# Patient Record
Sex: Male | Born: 1950 | Race: White | Hispanic: No | Smoking: Current every day smoker
Health system: Southern US, Community
[De-identification: ages and names within clinical notes are randomized; demographics above are authoritative.]

## PROBLEM LIST (undated history)

## (undated) DIAGNOSIS — T4145XA Adverse effect of unspecified anesthetic, initial encounter: Secondary | ICD-10-CM

## (undated) DIAGNOSIS — T8859XA Other complications of anesthesia, initial encounter: Secondary | ICD-10-CM

## (undated) DIAGNOSIS — I10 Essential (primary) hypertension: Secondary | ICD-10-CM

## (undated) DIAGNOSIS — R739 Hyperglycemia, unspecified: Secondary | ICD-10-CM

## (undated) DIAGNOSIS — Z87442 Personal history of urinary calculi: Secondary | ICD-10-CM

## (undated) DIAGNOSIS — F17200 Nicotine dependence, unspecified, uncomplicated: Secondary | ICD-10-CM

## (undated) DIAGNOSIS — J189 Pneumonia, unspecified organism: Secondary | ICD-10-CM

## (undated) DIAGNOSIS — M545 Low back pain, unspecified: Secondary | ICD-10-CM

## (undated) DIAGNOSIS — I00 Rheumatic fever without heart involvement: Secondary | ICD-10-CM

## (undated) DIAGNOSIS — M109 Gout, unspecified: Secondary | ICD-10-CM

## (undated) DIAGNOSIS — R7303 Prediabetes: Secondary | ICD-10-CM

## (undated) DIAGNOSIS — M1990 Unspecified osteoarthritis, unspecified site: Secondary | ICD-10-CM

## (undated) DIAGNOSIS — E785 Hyperlipidemia, unspecified: Secondary | ICD-10-CM

## (undated) HISTORY — DX: Hyperglycemia, unspecified: R73.9

## (undated) HISTORY — DX: Essential (primary) hypertension: I10

## (undated) HISTORY — DX: Low back pain, unspecified: M54.50

## (undated) HISTORY — DX: Gout, unspecified: M10.9

## (undated) HISTORY — DX: Hyperlipidemia, unspecified: E78.5

## (undated) HISTORY — PX: TONSILLECTOMY: SUR1361

## (undated) HISTORY — DX: Nicotine dependence, unspecified, uncomplicated: F17.200

## (undated) HISTORY — PX: APPENDECTOMY: SHX54

## (undated) HISTORY — DX: Unspecified osteoarthritis, unspecified site: M19.90

## (undated) HISTORY — DX: Rheumatic fever without heart involvement: I00

---
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
## 2012-05-25 ENCOUNTER — Emergency Department: Payer: Self-pay | Admitting: Emergency Medicine

## 2012-05-25 LAB — CBC
HCT: 44.5 % (ref 40.0–52.0)
HGB: 15.3 g/dL (ref 13.0–18.0)
Platelet: 201 10*3/uL (ref 150–440)
RDW: 14.1 % (ref 11.5–14.5)
WBC: 18 10*3/uL — ABNORMAL HIGH (ref 3.8–10.6)

## 2012-05-25 LAB — BASIC METABOLIC PANEL
Anion Gap: 8 (ref 7–16)
BUN: 16 mg/dL (ref 7–18)
Calcium, Total: 8.6 mg/dL (ref 8.5–10.1)
Creatinine: 1.63 mg/dL — ABNORMAL HIGH (ref 0.60–1.30)
EGFR (Non-African Amer.): 45 — ABNORMAL LOW
Glucose: 140 mg/dL — ABNORMAL HIGH (ref 65–99)
Osmolality: 285 (ref 275–301)
Potassium: 3.8 mmol/L (ref 3.5–5.1)

## 2012-05-25 LAB — URINALYSIS, COMPLETE
Bilirubin,UR: NEGATIVE
Glucose,UR: NEGATIVE mg/dL (ref 0–75)
Hyaline Cast: 7
Nitrite: NEGATIVE
Ph: 5 (ref 4.5–8.0)
Protein: NEGATIVE
Specific Gravity: 1.026 (ref 1.003–1.030)
WBC UR: 2 /HPF (ref 0–5)

## 2012-05-25 IMAGING — CT CT STONE STUDY
1 of 2 series · 15 of 32 positions shown, 19 images · non-contrast
Comparison: none

REASON FOR EXAM: SX OF RENAL COLIC
COMMENTS:

PROCEDURE:     CT  - CT ABDOMEN /PELVIS WO (STONE)  - [DATE]  [DATE]
RESULT:     Comparison: None
TECHNIQUE: Multiple axial images from the lung bases to the symphysis pubis
were obtained without oral and without intravenous contrast.

[Series 2: 3mm soft tissue · axial · 0.81mm/px · z∈[-933,-438]mm · 15 of 181 slices shown, 19 images]
[im 8/181  soft-tissue]
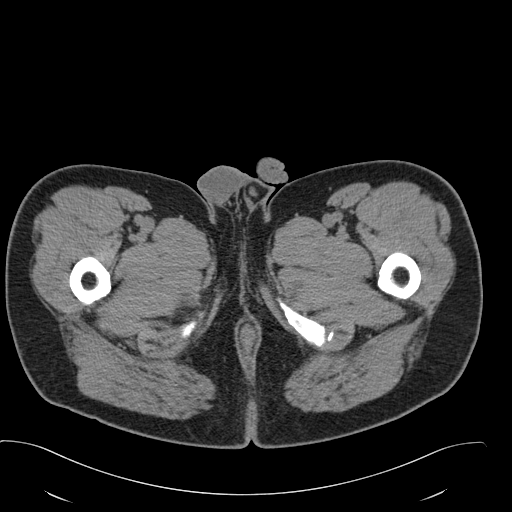
[im 8/181  bone]
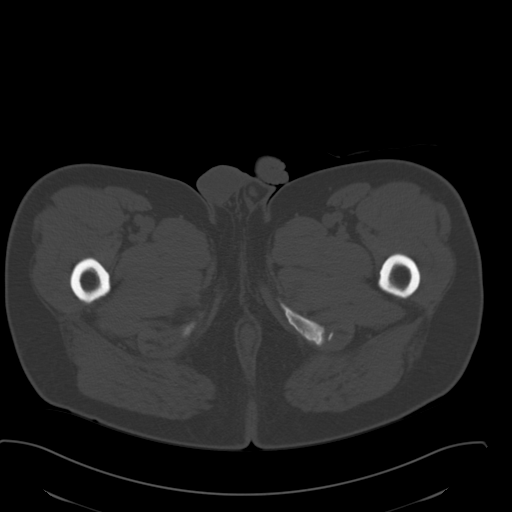
[im 24/181  soft-tissue]
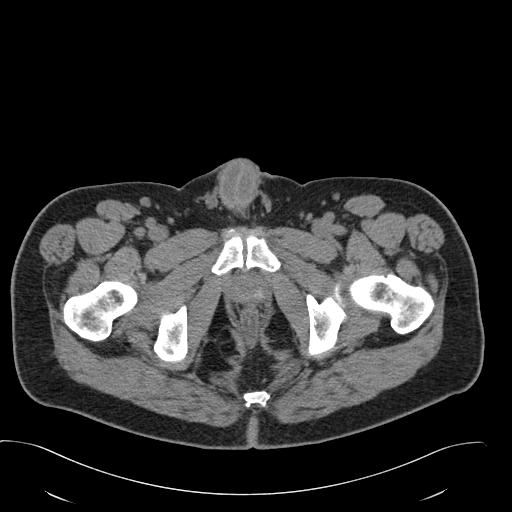
[im 40/181  soft-tissue]
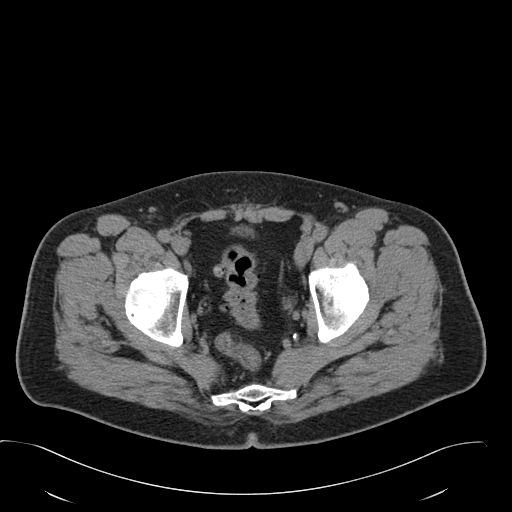
[im 47/181  soft-tissue]
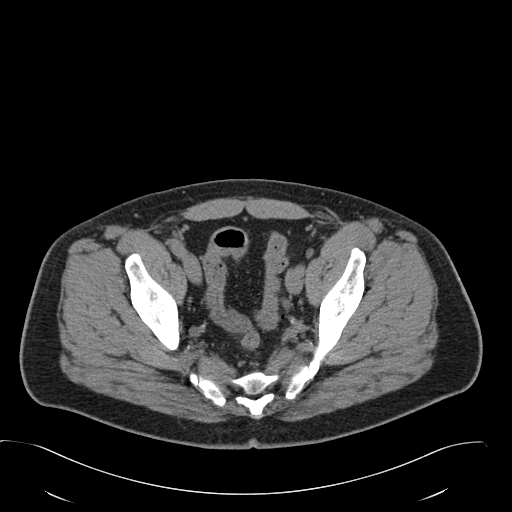
[im 63/181  soft-tissue]
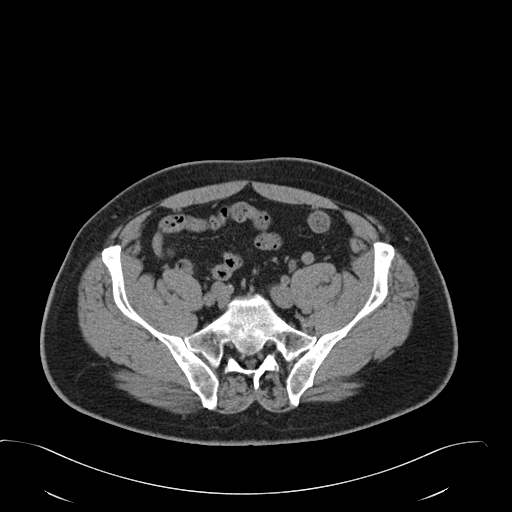
[im 79/181  soft-tissue]
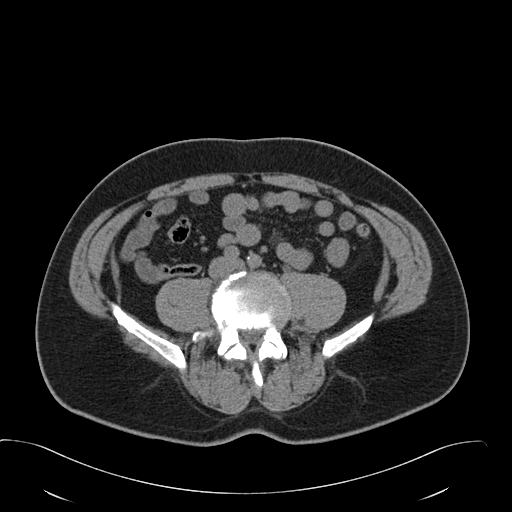
[im 94/181  soft-tissue]
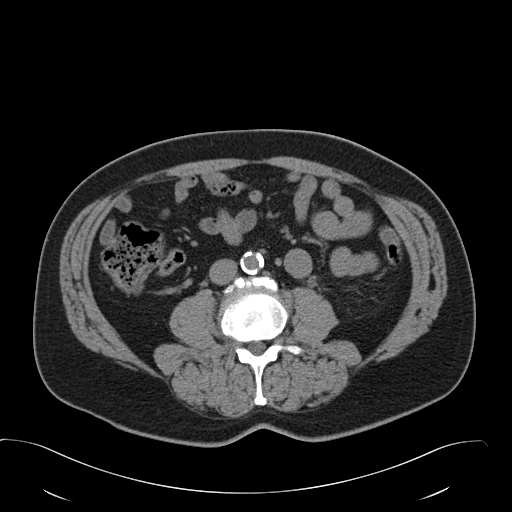
[im 102/181  soft-tissue]
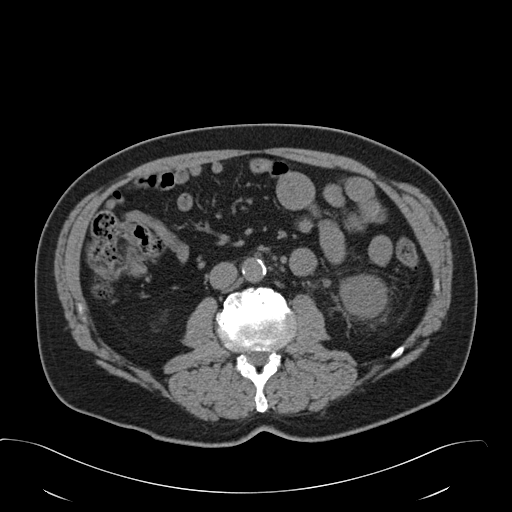
[im 118/181  soft-tissue]
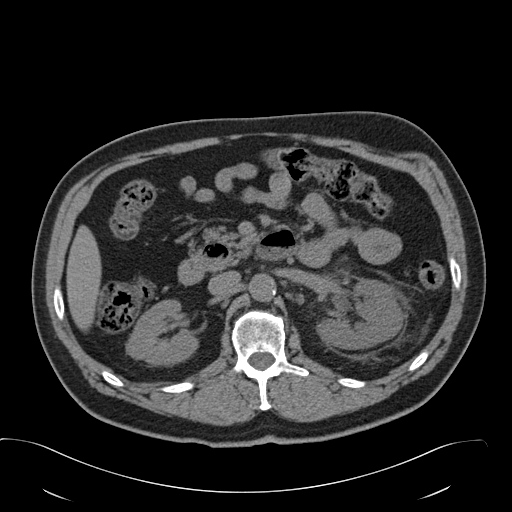
[im 118/181  bone]
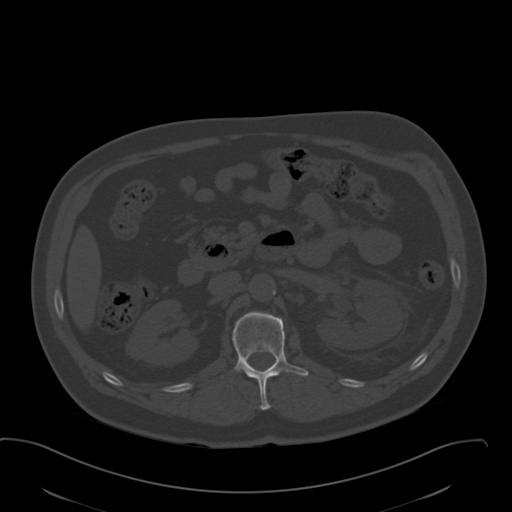
[im 134/181  soft-tissue]
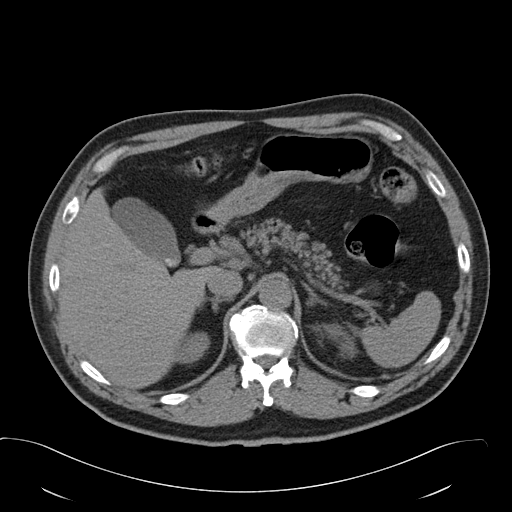
[im 141/181  soft-tissue]
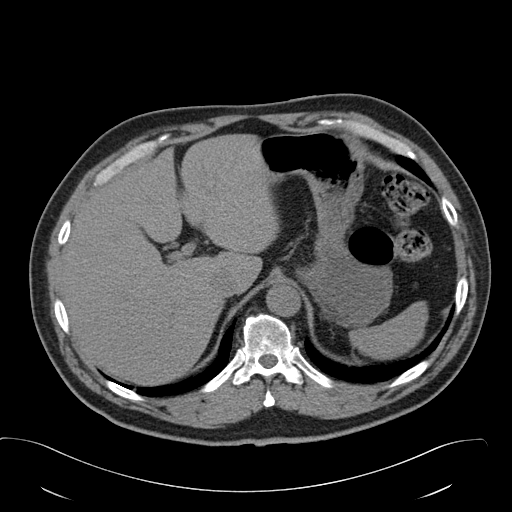
[im 149/181  lung]
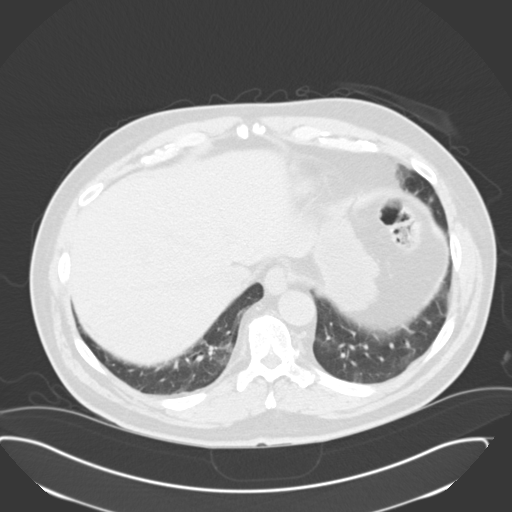
[im 157/181  soft-tissue]
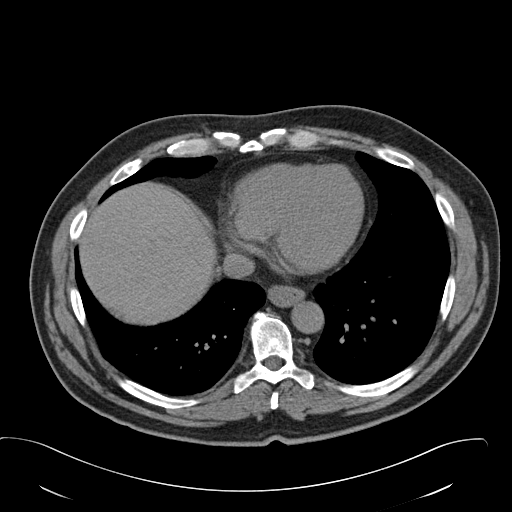
[im 157/181  lung]
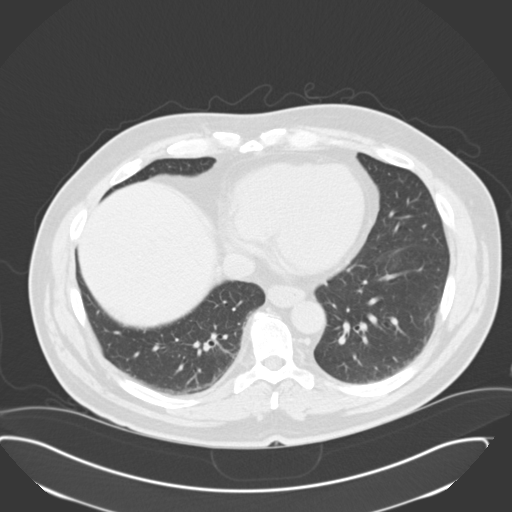
[im 165/181  lung]
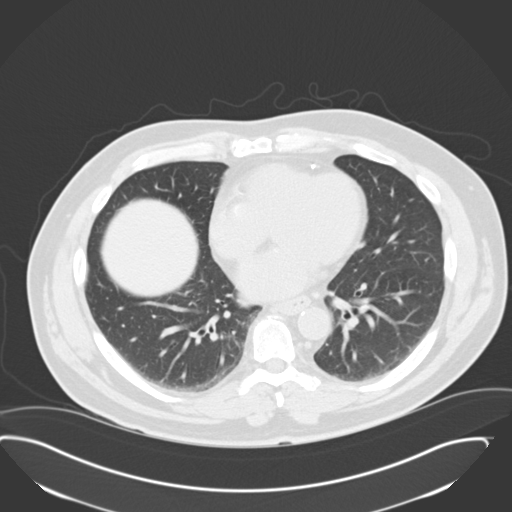
[im 173/181  soft-tissue]
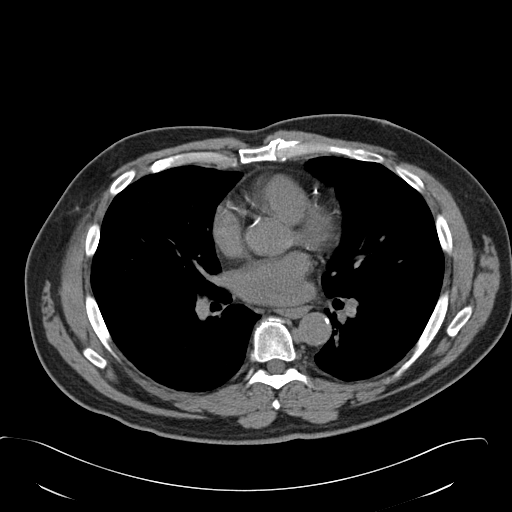
[im 173/181  lung]
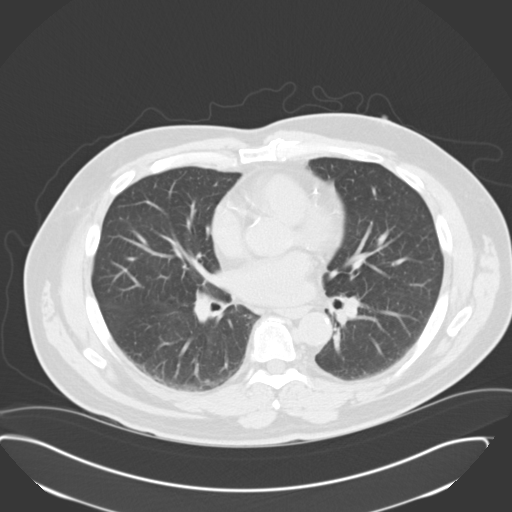

[15 of 32 positions shown; findings below may reference images not displayed]

FINDINGS: There is a 4 mm nodule at the posterior inferior costophrenic angle of the
right lower lobe, image 41. Mild basilar opacities are likely secondary to
atelectasis. There is a 6 mm subpleural nodule in the left lower lobe, image
28. Calcifications are seen in the coronary arteries.

Lack of intravenous contrast limits evaluation of the solid abdominal
organs.  Grossly, the liver, spleen, adrenals, and pancreas are
unremarkable. There is a small duodenal diverticulum extending medially from
the a second segment of the duodenum. Several calcified stones are seen
within the gallbladder.

There is mild left perinephric stranding. There is mild left
pelvicaliectasis and left ureterectasis. This is seen to the level of the
left ureterovesical junction where there is a 3 mm calculus.

There is mild diverticulosis of the sigmoid colon. The small and large bowel
are normal in caliber. There is mild diverticulosis of the descending colon.
The appendix is not visualized. However, there are no inflammatory changes
in the right lower quadrant. There is a small sclerotic density in the left
iliac wing which likely represents a bone island.
IMPRESSION: 1. There is a 3 mm calculus in the left ureterovesical junction causing mild
proximal obstruction.
2. Indeterminate pulmonary nodules, the largest of which measures 6 mm. If
the patient is at low risk for lung cancer, recommend 12 month follow-up
noncontrast chest CT.  If the patient is at high risk for lung cancer,
recommend 6 to 12 month follow-up noncontrast chest CT.

## 2012-05-25 IMAGING — CR DG CHEST 1V PORT
1 series · 1 of 1 positions shown · non-contrast
Comparison: none

REASON FOR EXAM: nodules on lung base on abdd ct
COMMENTS:

PROCEDURE:     DXR - DXR PORTABLE CHEST SINGLE VIEW  - [DATE] [DATE]
RESULT:     The lungs are mildly hypoinflated especially on the right. The
cardiac silhouette is normal in size. The perihilar lung markings are
increased. No discrete nodules are demonstrated.

[portable]
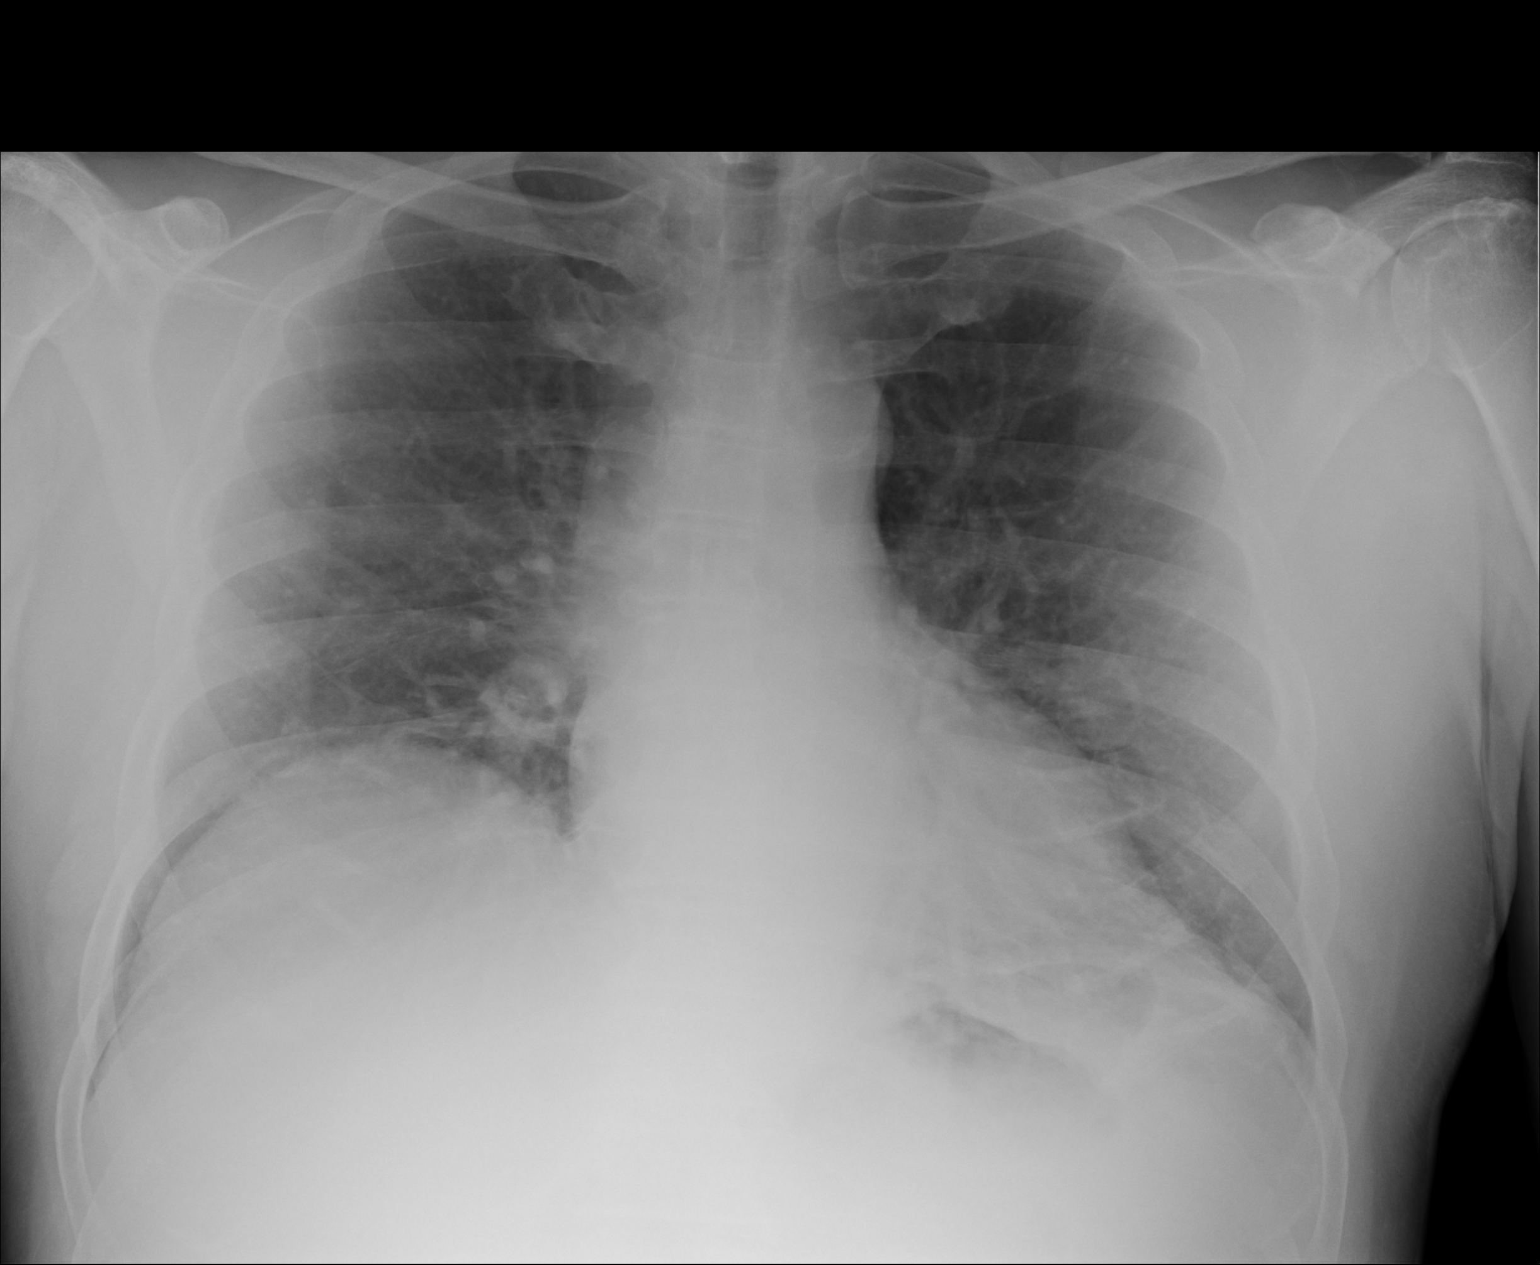

[1 of 1 positions shown; findings below may reference images not displayed]

IMPRESSION: There is bilateral pulmonary hypoinflation greatest on the
right. I do not see evidence of pneumonia nor abnormal nodules. A followup
PA and lateral chest x-ray with deep inspiration would be useful.

## 2019-11-02 ENCOUNTER — Ambulatory Visit (INDEPENDENT_AMBULATORY_CARE_PROVIDER_SITE_OTHER)
Admission: RE | Admit: 2019-11-02 | Discharge: 2019-11-02 | Disposition: A | Discharge status: Home / Self Care | Payer: Medicare Other | Source: Ambulatory Visit | Attending: Family Medicine | Admitting: Family Medicine

## 2019-11-02 ENCOUNTER — Ambulatory Visit (INDEPENDENT_AMBULATORY_CARE_PROVIDER_SITE_OTHER): Payer: Medicare Other | Admitting: Family Medicine

## 2019-11-02 ENCOUNTER — Other Ambulatory Visit: Payer: Self-pay

## 2019-11-02 ENCOUNTER — Encounter: Payer: Self-pay | Admitting: Family Medicine

## 2019-11-02 VITALS — BP 154/102 | HR 81 | Temp 96.6°F | Ht 67.0 in | Wt 210.0 lb

## 2019-11-02 DIAGNOSIS — M545 Low back pain, unspecified: Secondary | ICD-10-CM

## 2019-11-02 DIAGNOSIS — M109 Gout, unspecified: Secondary | ICD-10-CM | POA: Diagnosis not present

## 2019-11-02 DIAGNOSIS — M7918 Myalgia, other site: Secondary | ICD-10-CM

## 2019-11-02 DIAGNOSIS — I10 Essential (primary) hypertension: Secondary | ICD-10-CM

## 2019-11-02 DIAGNOSIS — M1990 Unspecified osteoarthritis, unspecified site: Secondary | ICD-10-CM

## 2019-11-02 DIAGNOSIS — E785 Hyperlipidemia, unspecified: Secondary | ICD-10-CM

## 2019-11-02 DIAGNOSIS — R739 Hyperglycemia, unspecified: Secondary | ICD-10-CM | POA: Diagnosis not present

## 2019-11-02 DIAGNOSIS — F17200 Nicotine dependence, unspecified, uncomplicated: Secondary | ICD-10-CM

## 2019-11-02 DIAGNOSIS — Z7189 Other specified counseling: Secondary | ICD-10-CM

## 2019-11-02 IMAGING — DX DG HIP (WITH OR WITHOUT PELVIS) 2V BILAT
5 series · 5 of 5 positions shown · non-contrast
Comparison: None.

CLINICAL DATA: Buttock pain

EXAM:
DG HIP (WITH OR WITHOUT PELVIS) 2V BILAT

[pelvis ap]
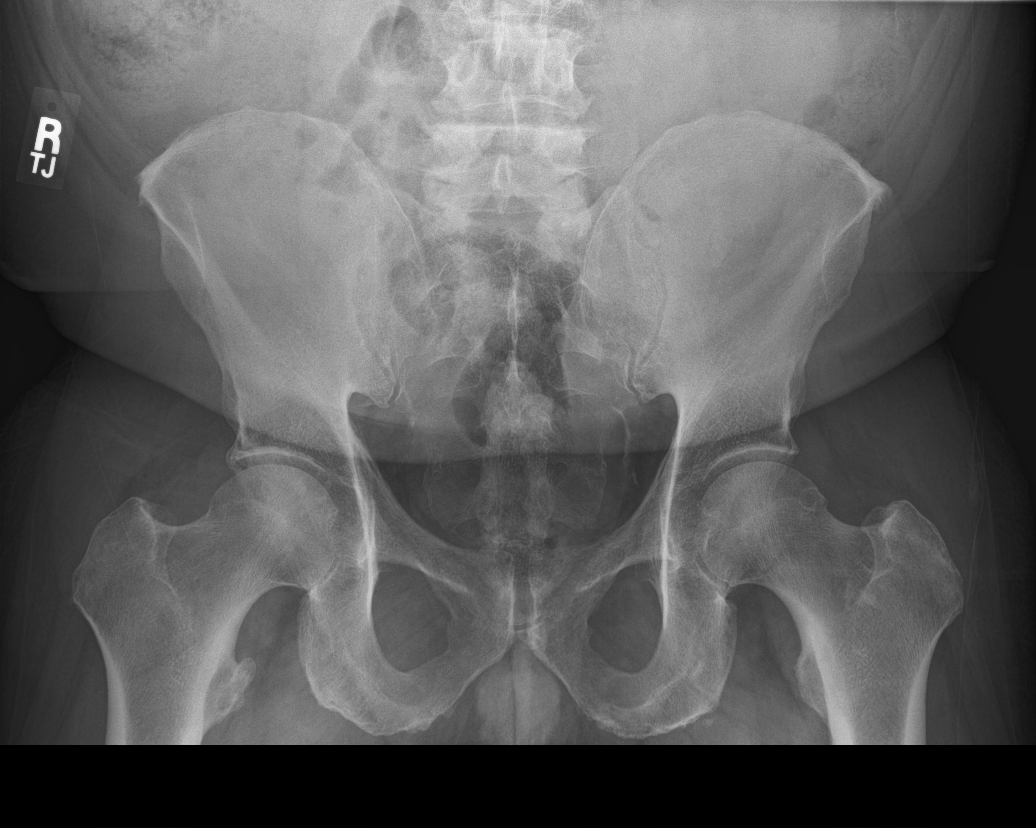

[hip ap (1 of 2)]
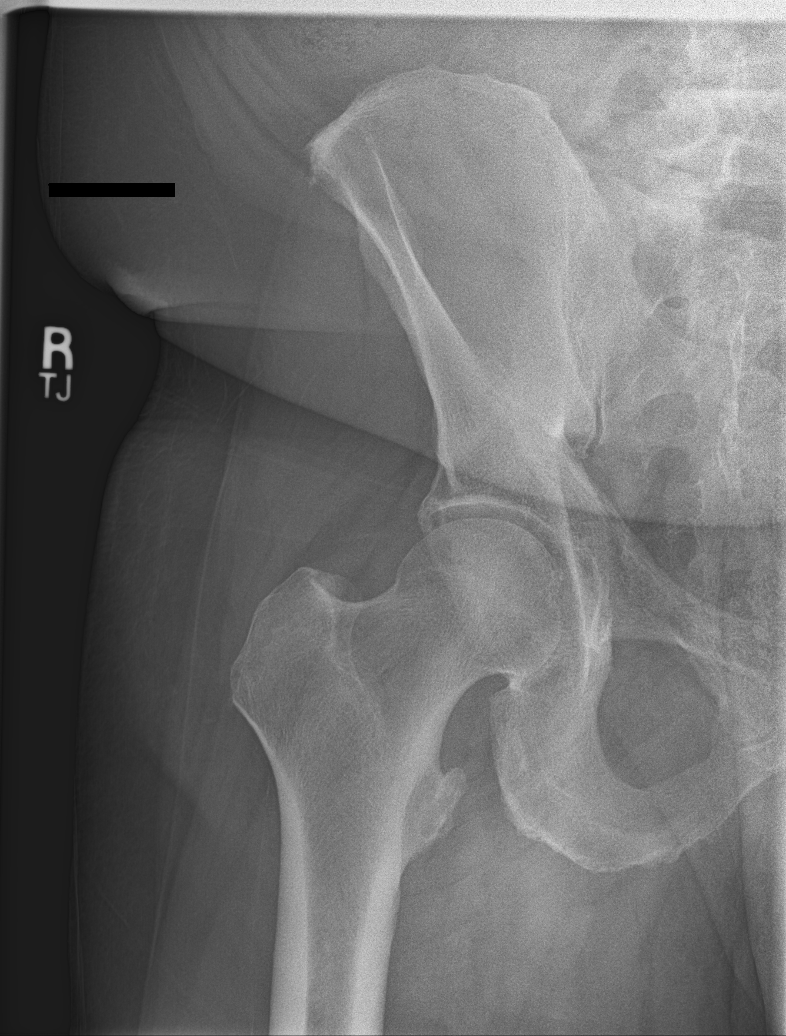

[hip lat (1 of 2)]
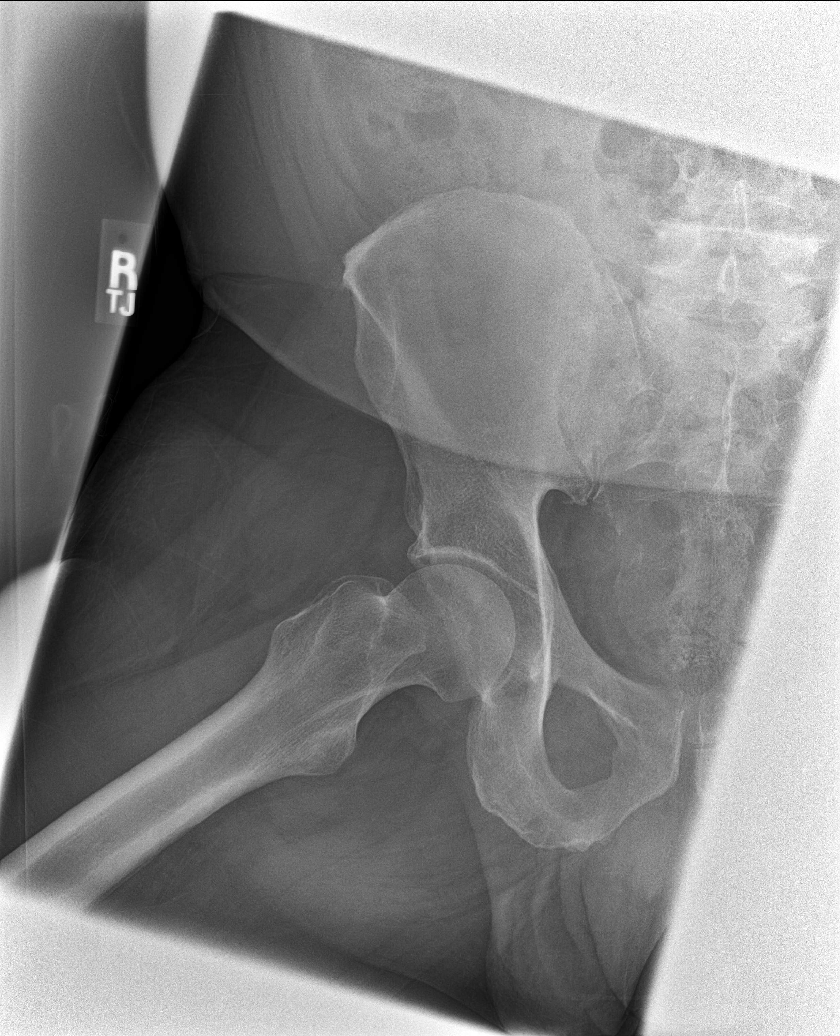

[hip ap (2 of 2)]
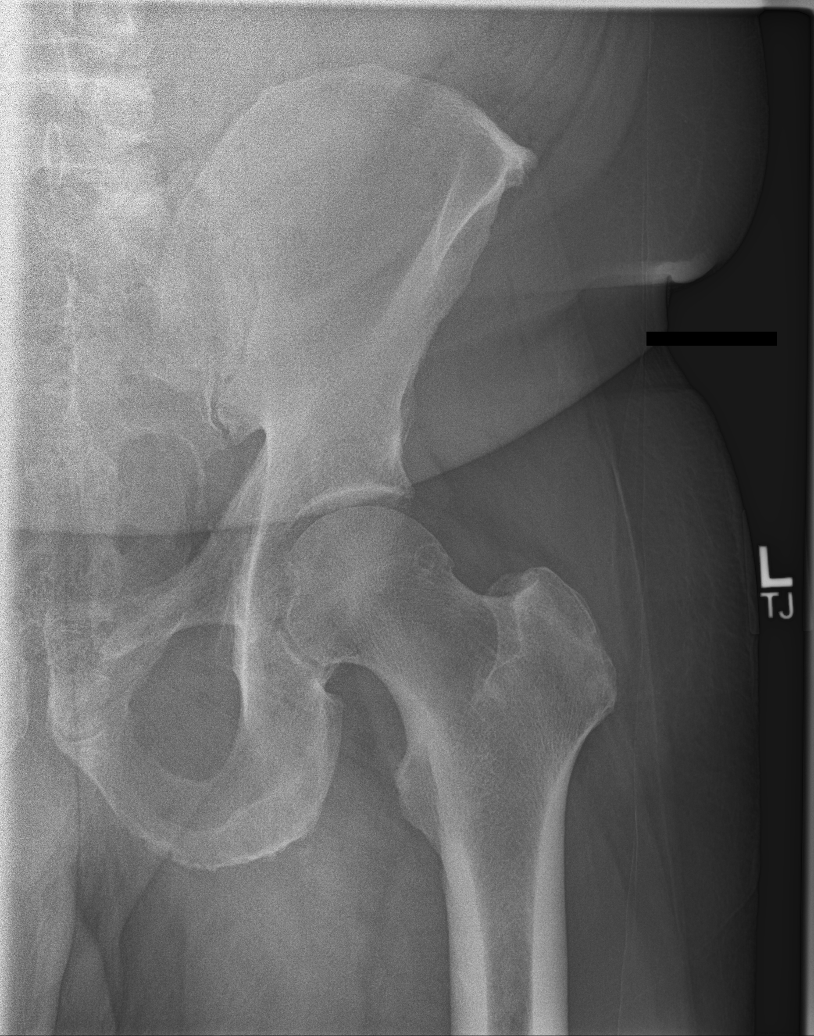

[hip lat (2 of 2)]
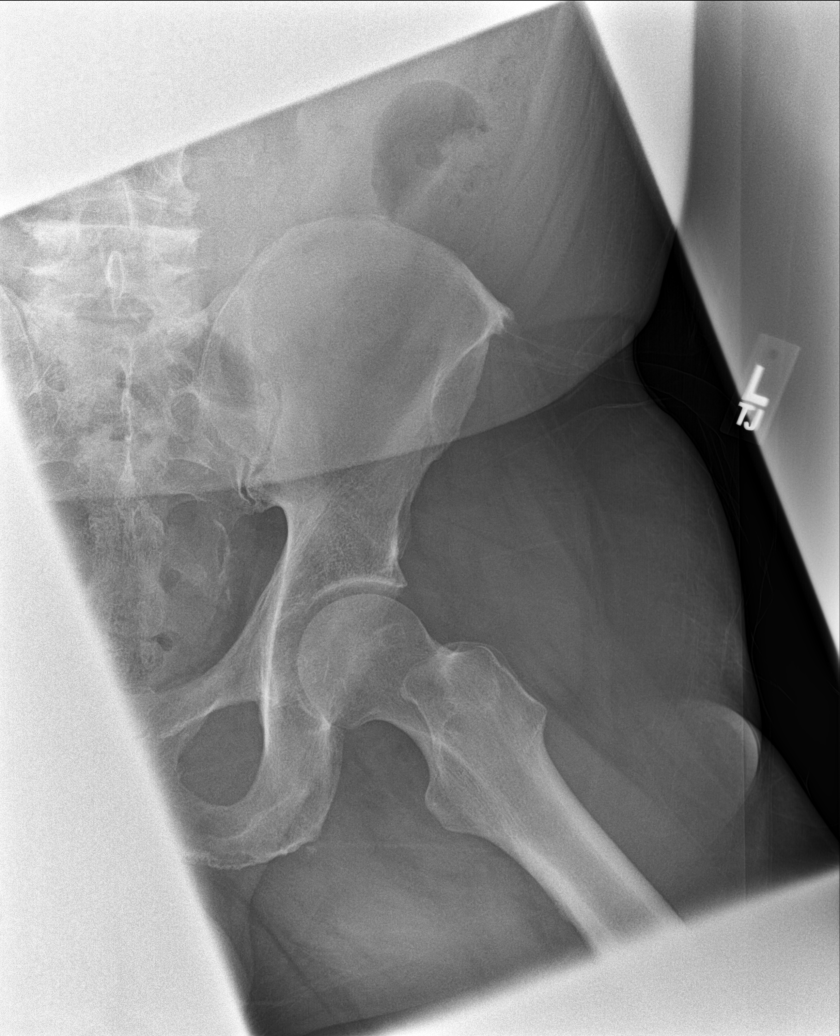

[5 of 5 positions shown; findings below may reference images not displayed]

FINDINGS: Mild joint space narrowing on the right. No fracture or
malalignment. Minimal femoral head neck spurring on the left. Pubic
symphysis and rami appear intact
IMPRESSION: Minimal degenerative change.  Otherwise negative

## 2019-11-02 IMAGING — DX DG LUMBAR SPINE COMPLETE 4+V
5 series · 5 of 5 positions shown · non-contrast
Comparison: CT [DATE]

CLINICAL DATA: Low back pain

EXAM:
LUMBAR SPINE - COMPLETE 4+ VIEW

[l-spine ap]
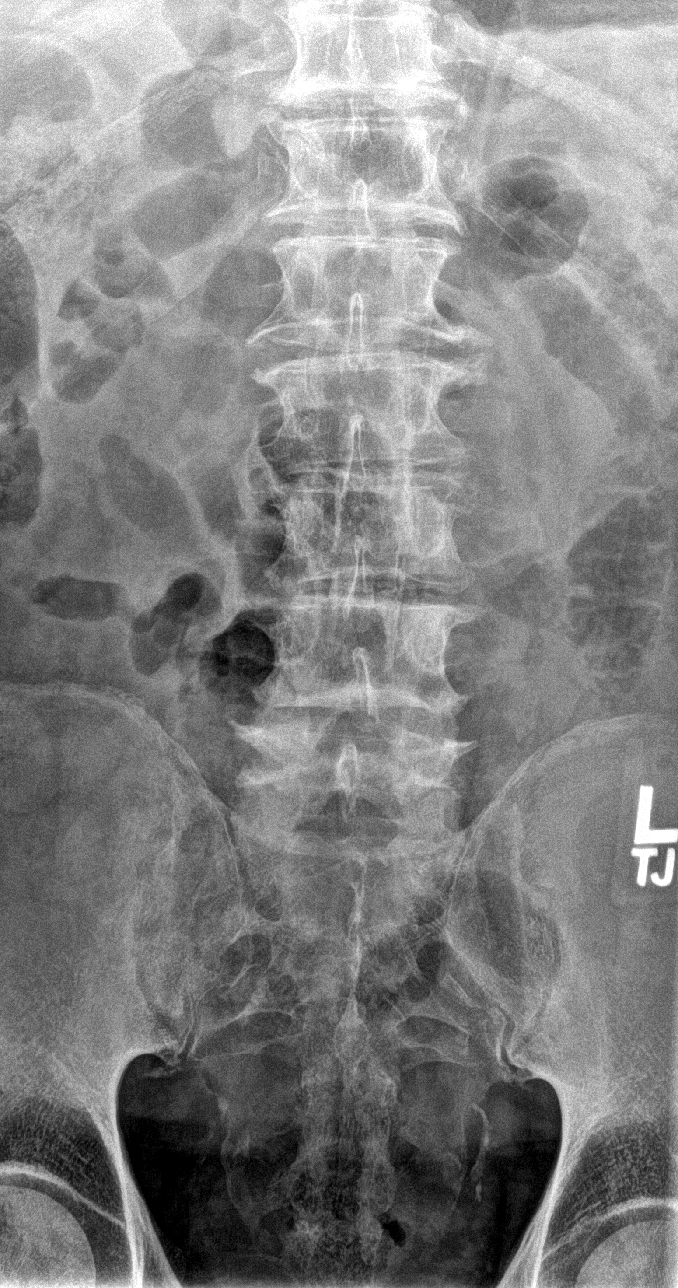

[l-spine obl (1 of 2)]
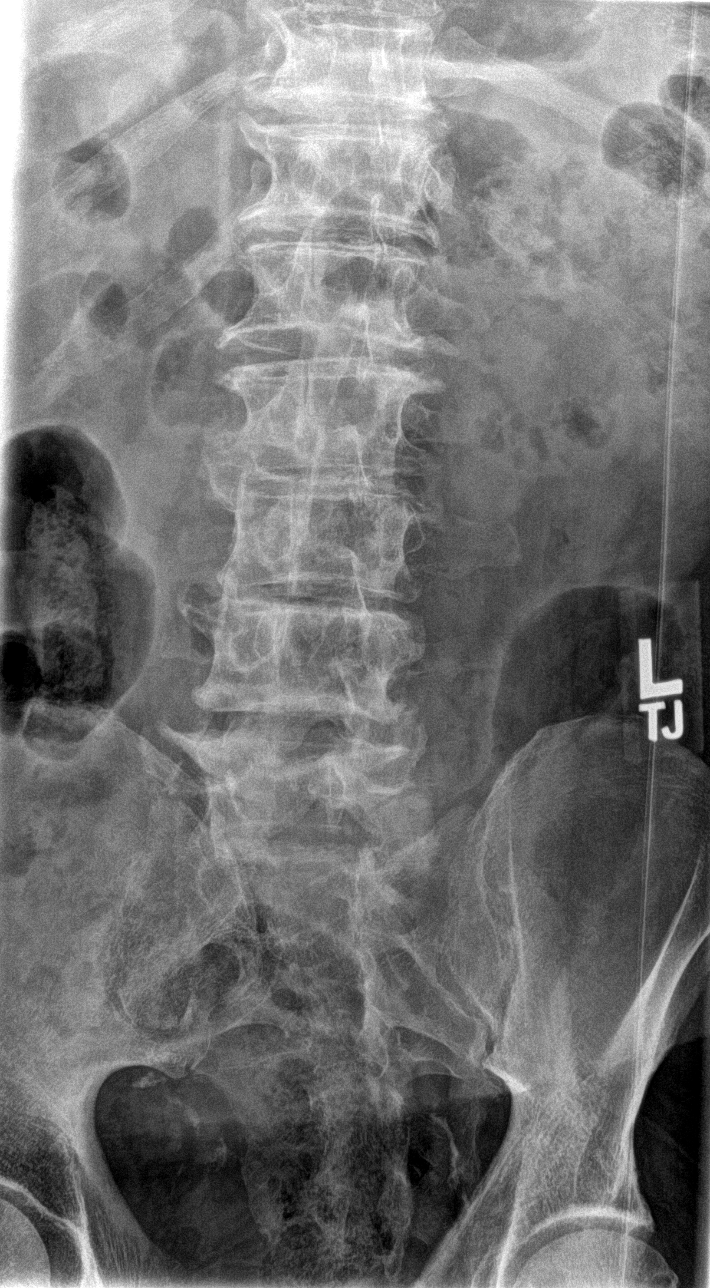

[l-spine obl (2 of 2)]
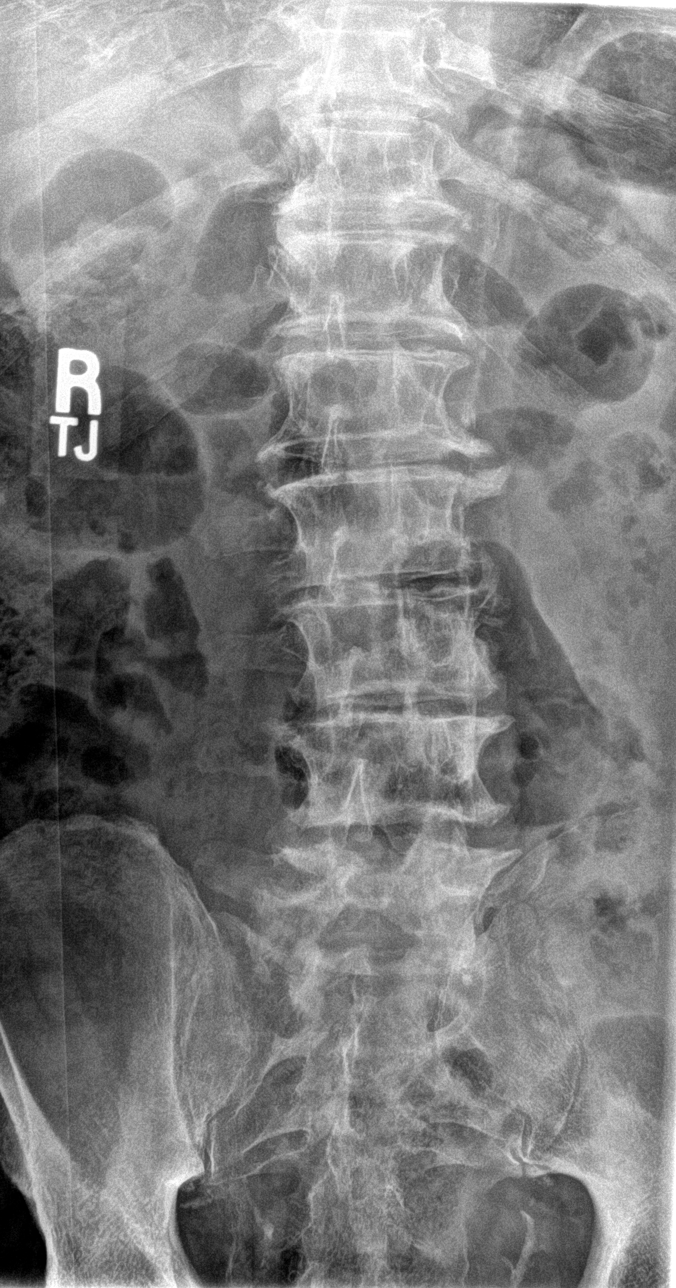

[l-spine lat]
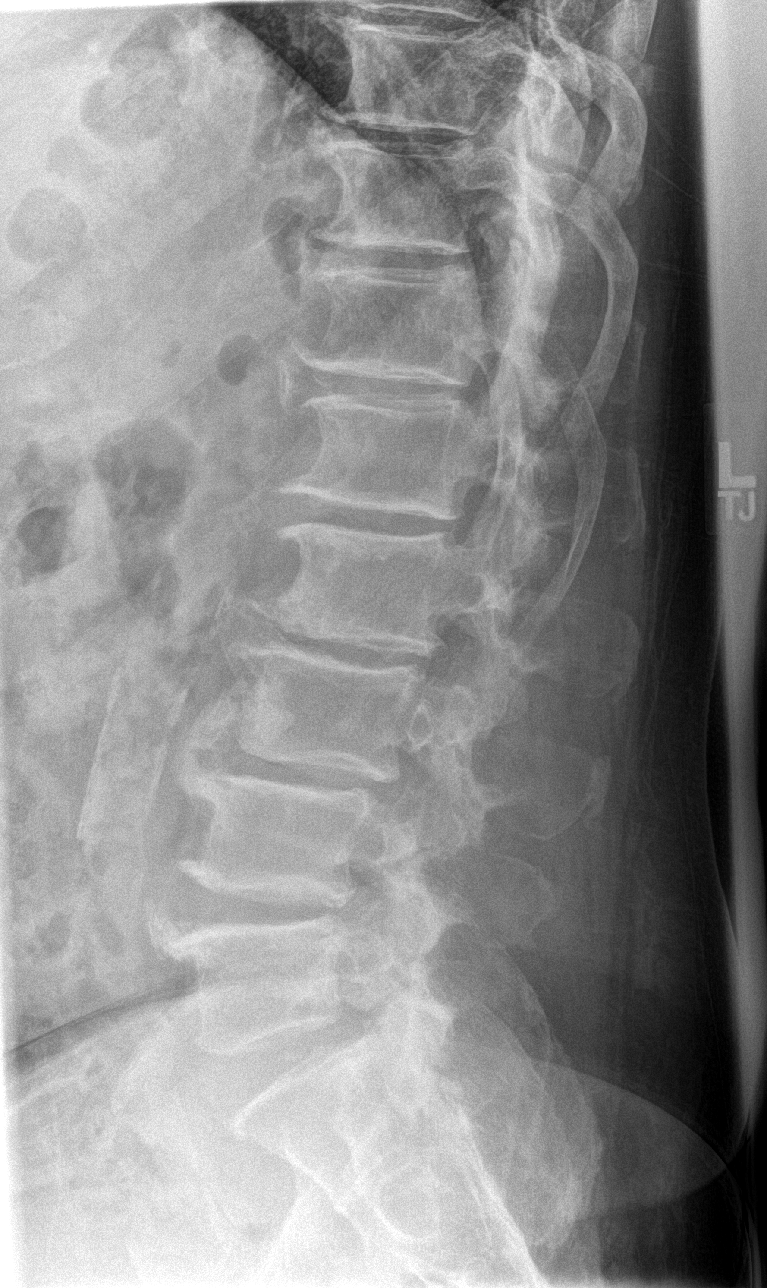

[l-spine l5/s1]
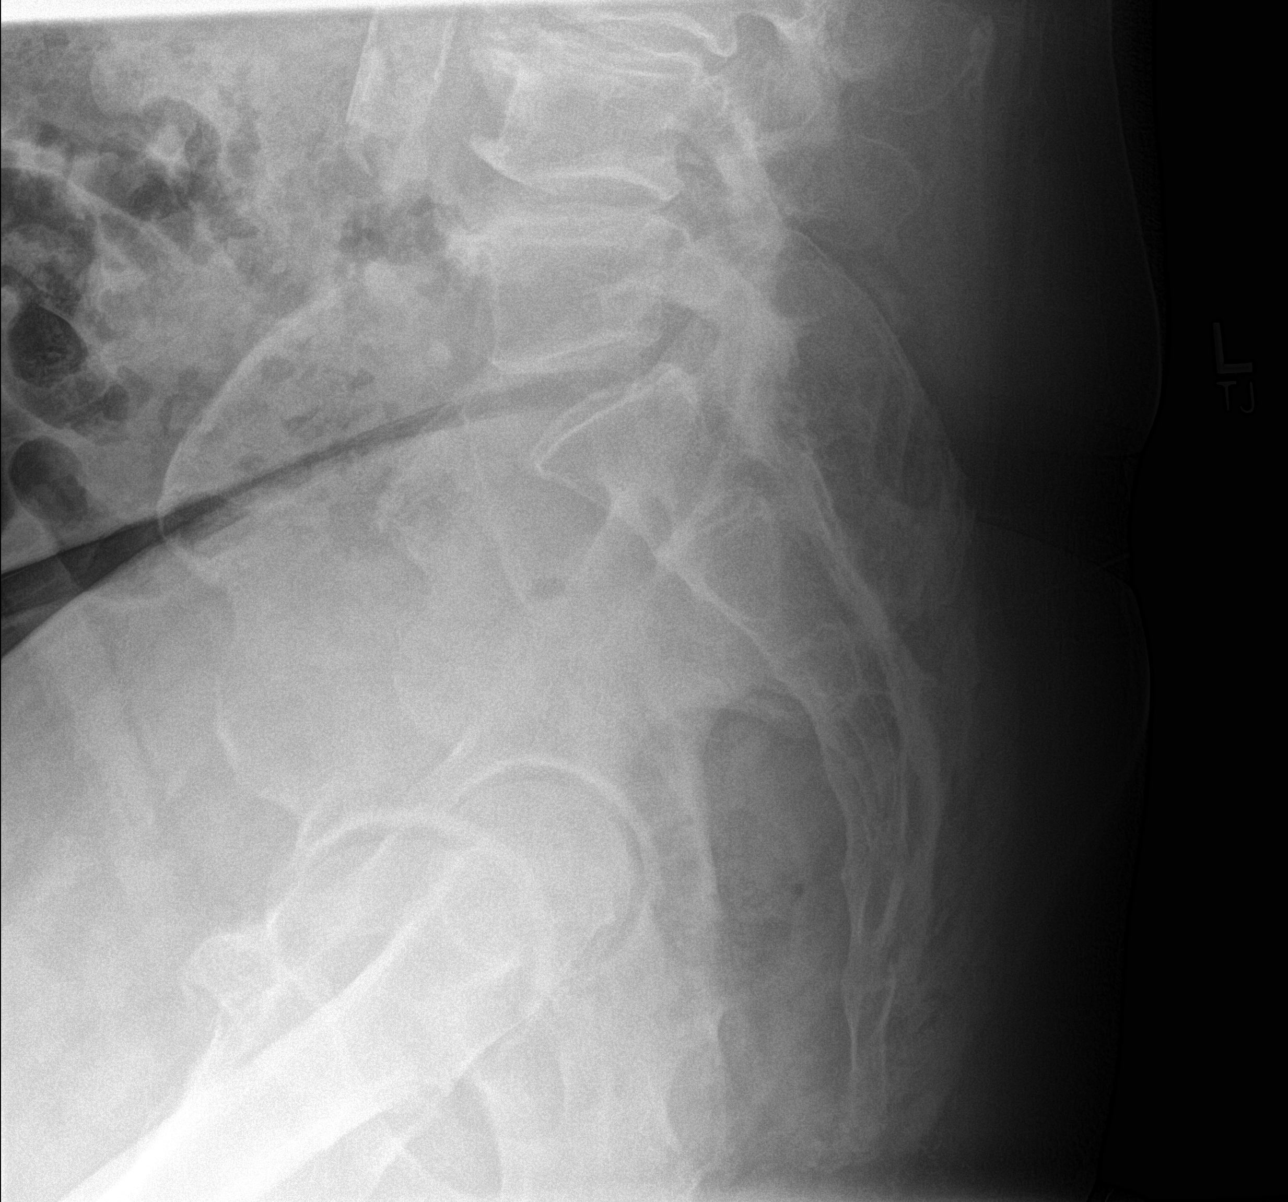

[5 of 5 positions shown; findings below may reference images not displayed]

FINDINGS: Five non rib-bearing lumbar type vertebra. 6 mm retrolisthesis L3 on
L4. Vertebral body heights are normal. Mild disc space narrowing at
L4-L5 and mild to moderate disc space narrowing L1 through L4. Bulky
anterior osteophytes at most levels. Facet degenerative changes of
the lower lumbar spine. Aortic atherosclerosis
IMPRESSION: 1. No acute osseous abnormality
2. Trace retrolisthesis L3 on L4. Diffuse degenerative changes of
the lumbar spine

## 2019-11-02 MED ORDER — METFORMIN HCL ER (MOD) 500 MG PO TB24: ORAL_TABLET | ORAL | Status: DC

## 2019-11-02 NOTE — Patient Instructions (Addendum)
Go to the lab on the way out.  We'll contact you with your lab and xray report.  Checking sugar, kidney, liver, cholesterol, gout and blood counts today.  Don't change your meds for now.  We'll request your old records.  Take care.  Glad to see you.

## 2019-11-03 LAB — COMPREHENSIVE METABOLIC PANEL
AG Ratio: 1.7 (calc) (ref 1.0–2.5)
ALT: 15 U/L (ref 9–46)
AST: 24 U/L (ref 10–35)
Albumin: 4.8 g/dL (ref 3.6–5.1)
Alkaline phosphatase (APISO): 45 U/L (ref 35–144)
BUN: 12 mg/dL (ref 7–25)
CO2: 35 mmol/L — ABNORMAL HIGH (ref 20–32)
Calcium: 9.7 mg/dL (ref 8.6–10.3)
Chloride: 98 mmol/L (ref 98–110)
Creat: 1.2 mg/dL (ref 0.70–1.25)
Globulin: 2.8 g/dL (calc) (ref 1.9–3.7)
Glucose, Bld: 88 mg/dL (ref 65–99)
Potassium: 4.3 mmol/L (ref 3.5–5.3)
Sodium: 139 mmol/L (ref 135–146)
Total Bilirubin: 0.7 mg/dL (ref 0.2–1.2)
Total Protein: 7.6 g/dL (ref 6.1–8.1)

## 2019-11-03 LAB — CBC WITH DIFFERENTIAL/PLATELET
Absolute Monocytes: 626 cells/uL (ref 200–950)
Basophils Absolute: 104 cells/uL (ref 0–200)
Basophils Relative: 1.2 %
Eosinophils Absolute: 435 cells/uL (ref 15–500)
Eosinophils Relative: 5 %
HCT: 43.9 % (ref 38.5–50.0)
Hemoglobin: 15.3 g/dL (ref 13.2–17.1)
Lymphs Abs: 2018 cells/uL (ref 850–3900)
MCH: 34.2 pg — ABNORMAL HIGH (ref 27.0–33.0)
MCHC: 34.9 g/dL (ref 32.0–36.0)
MCV: 98 fL (ref 80.0–100.0)
MPV: 12.4 fL (ref 7.5–12.5)
Monocytes Relative: 7.2 %
Neutro Abs: 5516 cells/uL (ref 1500–7800)
Neutrophils Relative %: 63.4 %
Platelets: 194 10*3/uL (ref 140–400)
RBC: 4.48 10*6/uL (ref 4.20–5.80)
RDW: 13.2 % (ref 11.0–15.0)
Total Lymphocyte: 23.2 %
WBC: 8.7 10*3/uL (ref 3.8–10.8)

## 2019-11-03 LAB — HEMOGLOBIN A1C
Hgb A1c MFr Bld: 6.1 % of total Hgb — ABNORMAL HIGH (ref <5.7)
Mean Plasma Glucose: 128 (calc)
eAG (mmol/L): 7.1 (calc)

## 2019-11-03 LAB — LIPID PANEL
Cholesterol: 159 mg/dL (ref <200)
HDL: 44 mg/dL (ref >=40)
LDL Cholesterol (Calc): 96 mg/dL (calc)
Non-HDL Cholesterol (Calc): 115 mg/dL (calc) (ref <130)
Total CHOL/HDL Ratio: 3.6 (calc) (ref <5.0)
Triglycerides: 98 mg/dL (ref <150)

## 2019-11-03 LAB — URIC ACID: Uric Acid, Serum: 4 mg/dL (ref 4.0–8.0)

## 2019-11-05 ENCOUNTER — Encounter: Payer: Self-pay | Admitting: Family Medicine

## 2019-11-05 DIAGNOSIS — M545 Low back pain, unspecified: Secondary | ICD-10-CM | POA: Insufficient documentation

## 2019-11-05 DIAGNOSIS — R739 Hyperglycemia, unspecified: Secondary | ICD-10-CM | POA: Insufficient documentation

## 2019-11-05 DIAGNOSIS — E785 Hyperlipidemia, unspecified: Secondary | ICD-10-CM | POA: Insufficient documentation

## 2019-11-05 DIAGNOSIS — I10 Essential (primary) hypertension: Secondary | ICD-10-CM | POA: Insufficient documentation

## 2019-11-05 DIAGNOSIS — F17200 Nicotine dependence, unspecified, uncomplicated: Secondary | ICD-10-CM | POA: Insufficient documentation

## 2019-11-05 NOTE — Progress Notes (Signed)
This visit occurred during the SARS-CoV-2 public health emergency.  Safety protocols were in place, including screening questions prior to the visit, additional usage of staff PPE, and extensive cleaning of exam room while observing appropriate contact time as indicated for disinfecting solutions.  New patient.  Establishing care.  Requesting records.  History of either hyperglycemia versus diabetes.  Currently treated with Metformin.  No adverse effect on medication.  Due for labs.  History of gout.  Improved with allopurinol use.  Compliant.  Due for labs.  History of hypertension.  Compliant with losartan.  No chest pain, shortness of breath, bilateral lower extremity swelling.  Labs pending.  Hyperlipidemia.  Treated with Lipitor.  Compliant.  No adverse effect on medication.  He has some other aches described below but these predate statin use.  Hip and back pain.  He does not have pain sitting but he has lower back and bilateral buttock pain when he is standing.  He thinks it is affecting his balance.  He does not have clear vertigo symptoms.  Lower back pain has been getting gradually worse over the last 2 years approximately.  He is clearly better when he sits down.  No radicular pain.  No focal lower extremity weakness.  Smoking three-quarter pack per day.  Cutting back.  Discussed.  Advance directive discussed with patient.  Wife designated if patient were incapacitated.  He does not want to be put on a ventilator under any circumstances.    PMH and SH reviewed  ROS: Per HPI unless specifically indicated in ROS section   Meds, vitals, and allergies reviewed.   GEN: nad, alert and oriented HEENT: ncat, TM wnl B NECK: supple w/o LA CV: rrr. PULM: ctab, no inc wob ABD: soft, +bs EXT: no edema SKIN: no acute rash Tender to palpation lateral to midline in the lower back. Strength and sensation still intact for the bilateral lower extremities.  Normal hip range of motion.  He has  more back pain when he gets up and walks.

## 2019-11-06 ENCOUNTER — Other Ambulatory Visit: Payer: Self-pay | Admitting: Family Medicine

## 2019-11-06 DIAGNOSIS — M1990 Unspecified osteoarthritis, unspecified site: Secondary | ICD-10-CM | POA: Insufficient documentation

## 2019-11-06 DIAGNOSIS — M109 Gout, unspecified: Secondary | ICD-10-CM | POA: Insufficient documentation

## 2019-11-06 DIAGNOSIS — Z7189 Other specified counseling: Secondary | ICD-10-CM | POA: Insufficient documentation

## 2019-11-06 MED ORDER — PREDNISONE 10 MG PO TABS: ORAL_TABLET | ORAL | 0 refills | Status: DC

## 2019-11-06 NOTE — Assessment & Plan Note (Addendum)
He has pain when he gets up and walks and gets better when he sits down.  Normal hip range of motion.  Check plain films today but the concern is for spinal stenosis which may not be revealed on plain films.  Discussed.  See notes on imaging.  Anatomy discussed with patient. At least 30 minutes were devoted to patient care in this encounter (this can potentially include time spent reviewing the patient's file/history, interviewing and examining the patient, counseling/reviewing plan with patient, ordering referrals, ordering tests, reviewing relevant laboratory or x-ray data, and documenting the encounter).

## 2019-11-06 NOTE — Assessment & Plan Note (Signed)
See notes on labs.  Continue medication as is for now.

## 2019-11-06 NOTE — Assessment & Plan Note (Signed)
No change in meds today.  He can check his blood pressure out of the clinic and notify me if persistently elevated.  See notes on labs.

## 2019-11-06 NOTE — Assessment & Plan Note (Signed)
No change in meds.  I do not think his back pain is related to statin use.  He does not have diffuse aches otherwise that would be typical for statin adverse effect.  See notes on labs.

## 2019-11-06 NOTE — Assessment & Plan Note (Signed)
Smoking three-quarter pack per day.  Cutting back.  Discussed.

## 2019-11-06 NOTE — Assessment & Plan Note (Signed)
He improved with allopurinol use, with decrease in flare frequency.  Continue as is.  See notes on labs.

## 2019-11-06 NOTE — Assessment & Plan Note (Signed)
Advance directive discussed with patient.  Wife designated if patient were incapacitated.  He does not want to be put on a ventilator under any circumstances.

## 2019-11-27 ENCOUNTER — Telehealth: Payer: Self-pay

## 2019-11-27 DIAGNOSIS — M545 Low back pain, unspecified: Secondary | ICD-10-CM

## 2019-11-27 NOTE — Telephone Encounter (Signed)
I would try to get MRI set up.  That is likely the best option.  Is he willing to go for that?

## 2019-11-27 NOTE — Telephone Encounter (Signed)
Left detailed message on voicemail of Aram Beecham (patient's daughter).

## 2019-11-27 NOTE — Telephone Encounter (Signed)
Mandy RN said;Pt has update since taking prednisone. Prednisone has not helped. Wants to move forward with next steps. Please call Aram Beecham pts daughter with further questions or instructions.

## 2019-11-28 NOTE — Telephone Encounter (Signed)
MRI ordered.  Routed to [facility] as FYI.  Thanks.  Marland Kitchen

## 2019-11-28 NOTE — Telephone Encounter (Signed)
Patient's Daughter called back She stated that she spoke with the patient last night and he would like to go ahead with the MRI.  Timothy Hester stated that they do not have a preference on [place] or [place] location.  Would like to go to which ever has first available appointment

## 2019-11-29 ENCOUNTER — Telehealth: Payer: Self-pay | Admitting: Family Medicine

## 2019-11-29 MED ORDER — DIAZEPAM 5 MG PO TABS: 2.5000 mg | ORAL_TABLET | Freq: Once | ORAL | 0 refills | Status: AC

## 2019-11-29 NOTE — Telephone Encounter (Signed)
Prescription sent for diazepam. Take prior to MRI.  Sedation caution.  Needs a driver.  Thanks.

## 2019-11-29 NOTE — Telephone Encounter (Signed)
Patient notified as instructed by telephone and verbalized understanding. Patient stated that he needs to know when this is scheduled. After speaking with Shirlee Limerick patient was advised that his daughter Aram Beecham is woking on getting this scheduled for him. Patient was advised to talk with his daughter about the appointment.

## 2019-11-29 NOTE — Telephone Encounter (Signed)
Patient is claustrophobic and is requesting something to be called in for him to have the MRI.

## 2019-12-22 ENCOUNTER — Other Ambulatory Visit: Payer: Self-pay | Admitting: Family Medicine

## 2019-12-22 ENCOUNTER — Ambulatory Visit
Admission: RE | Admit: 2019-12-22 | Discharge: 2019-12-22 | Disposition: A | Discharge status: Home / Self Care | Payer: Medicare Other | Source: Ambulatory Visit | Attending: Family Medicine | Admitting: Family Medicine

## 2019-12-22 DIAGNOSIS — M545 Low back pain, unspecified: Secondary | ICD-10-CM

## 2019-12-22 DIAGNOSIS — Z77018 Contact with and (suspected) exposure to other hazardous metals: Secondary | ICD-10-CM

## 2019-12-22 IMAGING — MR MR LUMBAR SPINE W/O CM
4 of 5 series · 25 of 48 positions shown · non-contrast
Comparison: Radiography [DATE]

CLINICAL DATA: Low back pain radiating to the buttocks with
weakness in both legs.

EXAM:
MRI LUMBAR SPINE WITHOUT CONTRAST
TECHNIQUE: Multiplanar, multisequence MR imaging of the lumbar spine was
performed. No intravenous contrast was administered.

[Series 2: T2 · sagittal · 4.0mm · 0.55mm/px · 8 of 19 slices shown (1 of 2)]
[im 1/19]
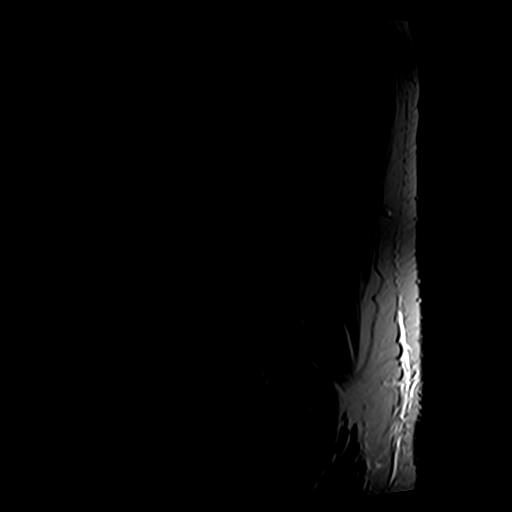
[im 3/19]
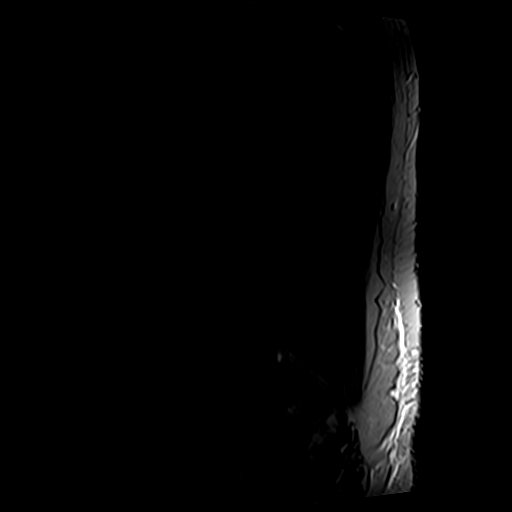
[im 6/19]
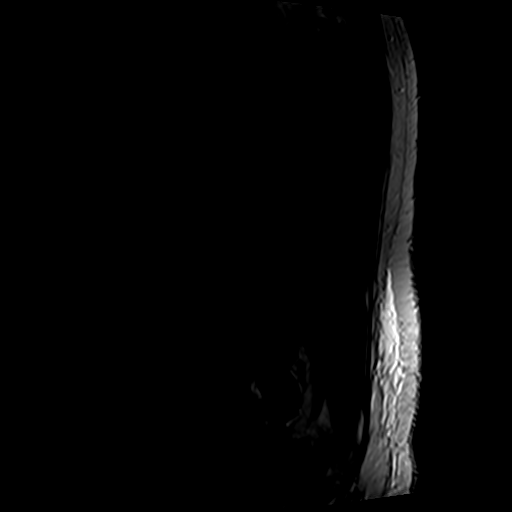
[im 8/19]
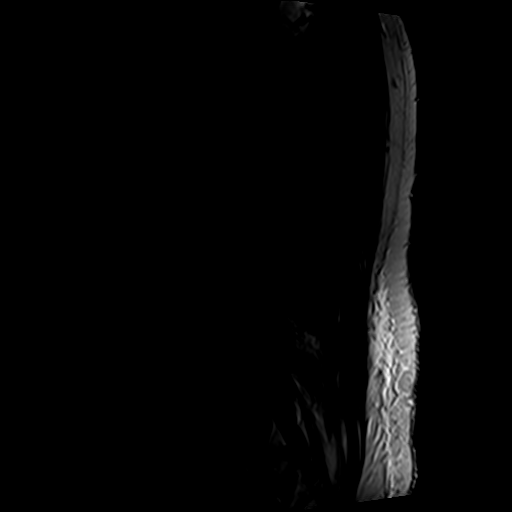
[im 11/19]
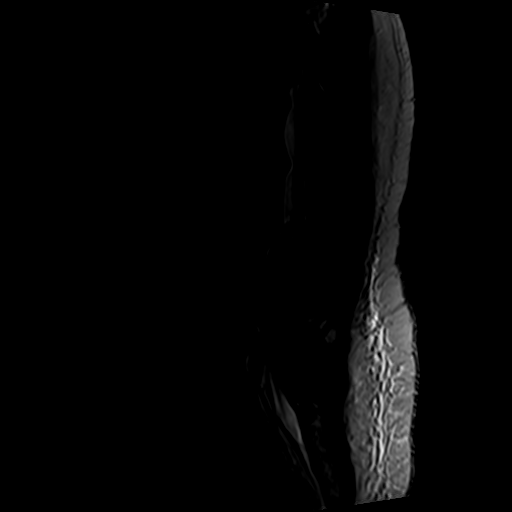
[im 13/19]
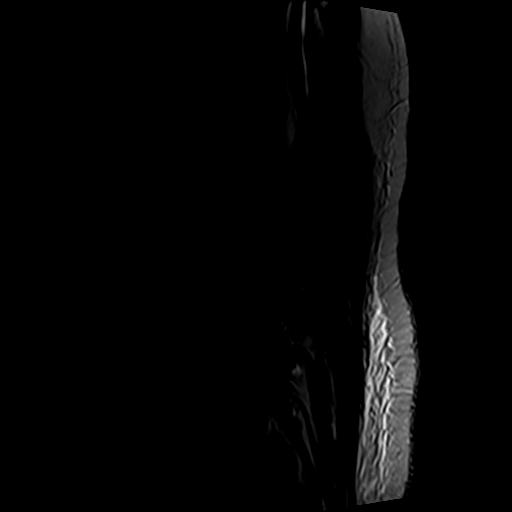
[im 16/19]
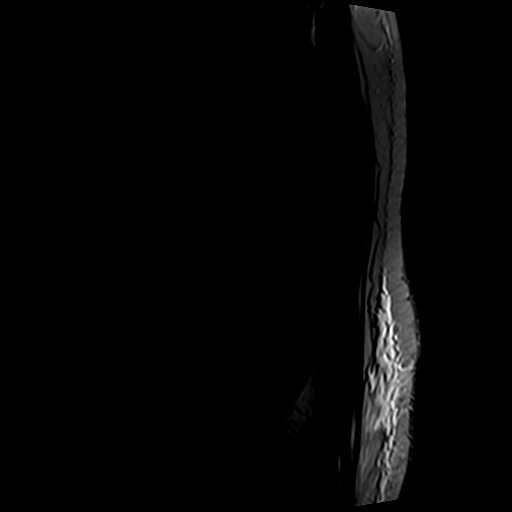
[im 19/19]
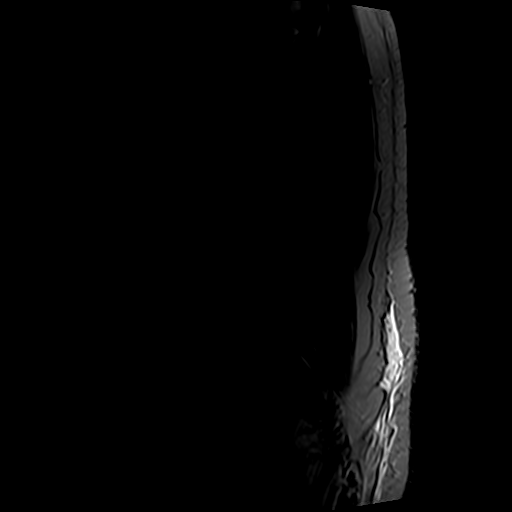

[Series 4: T1 · sagittal · 4.0mm · 0.55mm/px · 5 of 19 slices shown (1 of 2)]
[im 1/19]
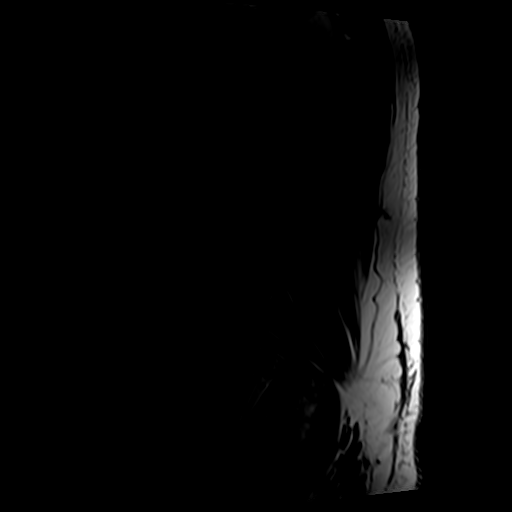
[im 4/19]
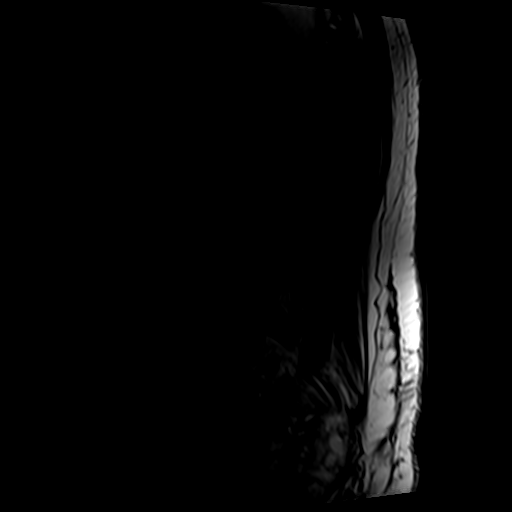
[im 7/19]
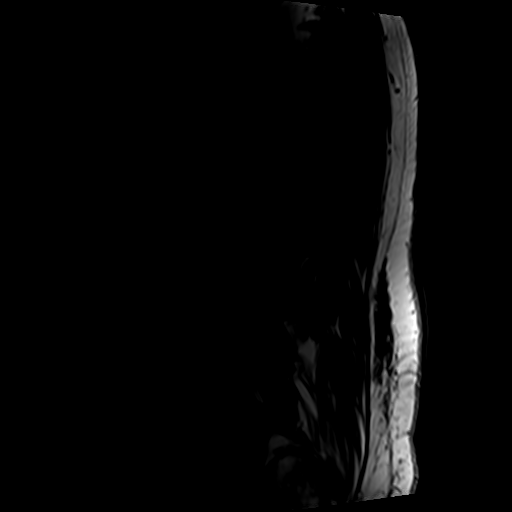
[im 10/19]
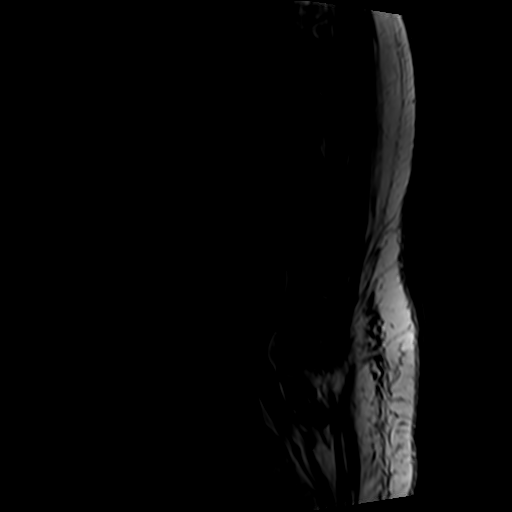
[im 16/19]
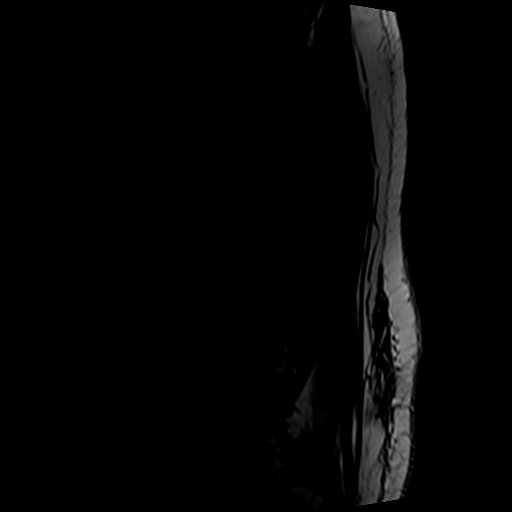

[Series 5: T2 · axial · 4.0mm · 0.70mm/px · z∈[-104,+91]mm · 9 of 36 slices shown (2 of 2)]
[im 1/36]
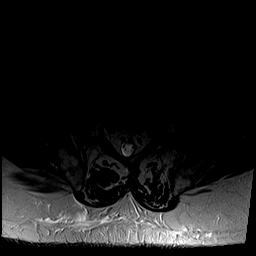
[im 6/36]
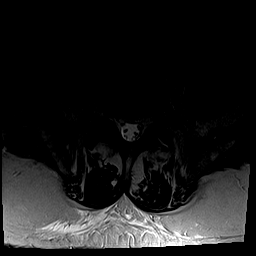
[im 12/36]
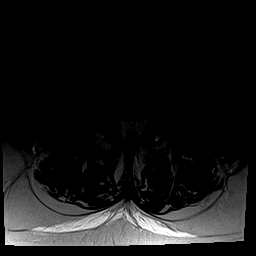
[im 15/36]
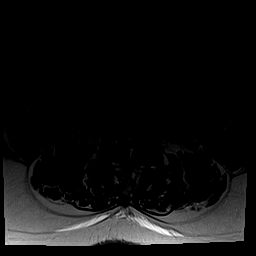
[im 18/36]
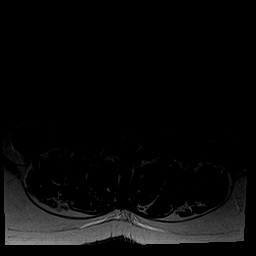
[im 21/36]
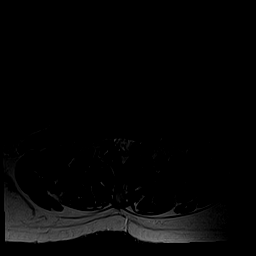
[im 24/36]
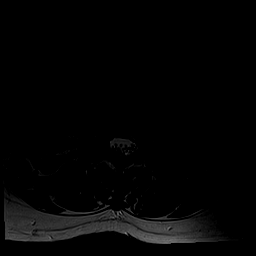
[im 30/36]
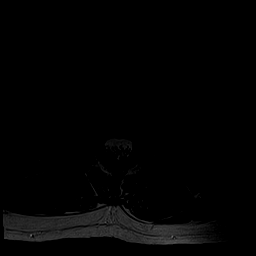
[im 36/36]
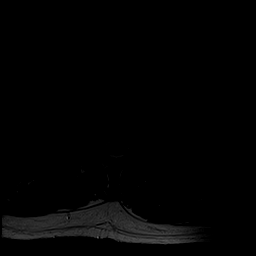

[Series 6: T1 · axial · 4.0mm · 0.35mm/px · z∈[-78,+59]mm · 3 of 36 slices shown (2 of 2)]
[im 6/36]
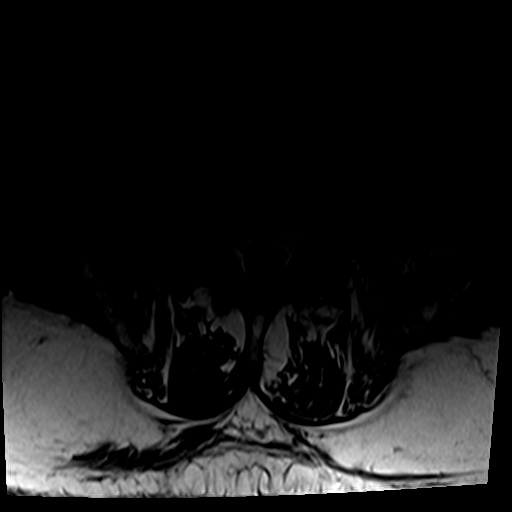
[im 18/36]
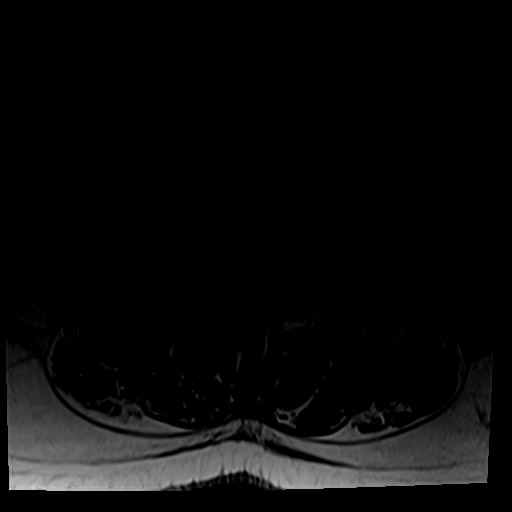
[im 30/36]
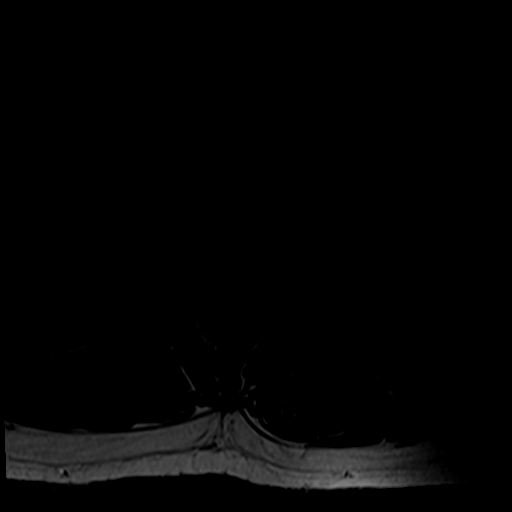

[25 of 48 positions shown; findings below may reference images not displayed]

FINDINGS: Segmentation:  5 lumbar type vertebral bodies.

Alignment:  Straightening of the normal lumbar lordosis.

Vertebrae:  No fracture or primary bone lesion.

Conus medullaris and cauda equina: Conus extends to the L1 level.
Conus and cauda equina appear normal.

Paraspinal and other soft tissues: Negative

Disc levels:

Minimal non-compressive disc bulges at L2-3 and above.

L3-4: Broad-based disc herniation more prominent towards the left.
Bilateral facet and ligamentous hypertrophy. Multifactorial spinal
stenosis at this level that could cause neural compression on either
or both sides, more likely the left. Bilateral foraminal stenosis
could affect the exiting L3 nerves.

L4-5: Bulging of the disc. Facet and ligamentous hypertrophy. Mild
to moderate multifactorial stenosis at this level, not as severe as
the L3-4 level.

L5-S1: Normal appearance of the disc. Mild facet degeneration. No
canal or foraminal stenosis.
IMPRESSION: Multifactorial spinal stenosis at L3-4 likely to be symptomatic.
Broad-based disc herniation more prominent in the left
posterolateral direction. Facet and ligamentous hypertrophy. Neural
compression could occur on either or both sides, especially the
left. Bilateral foraminal stenosis could affect the exiting L3
nerves.

Mild, grossly non-compressive degenerative changes at L1-2, L2-3 and
L5-S1.

Lesser stenosis at the L4-5 level due to bulging of the disc in
combination with facet and ligamentous hypertrophy. Some potential
this could be significant, though the stenosis is not as severe as
seen at the L3-4 level.

## 2019-12-22 IMAGING — CR DG ORBITS FOR FOREIGN BODY
2 series · 2 of 2 positions shown · non-contrast
Comparison: None.

CLINICAL DATA: Metal working/exposure; clearance prior to MRI

EXAM:
ORBITS FOR FOREIGN BODY - 2 VIEW

[w orbit pa (1 of 2)]
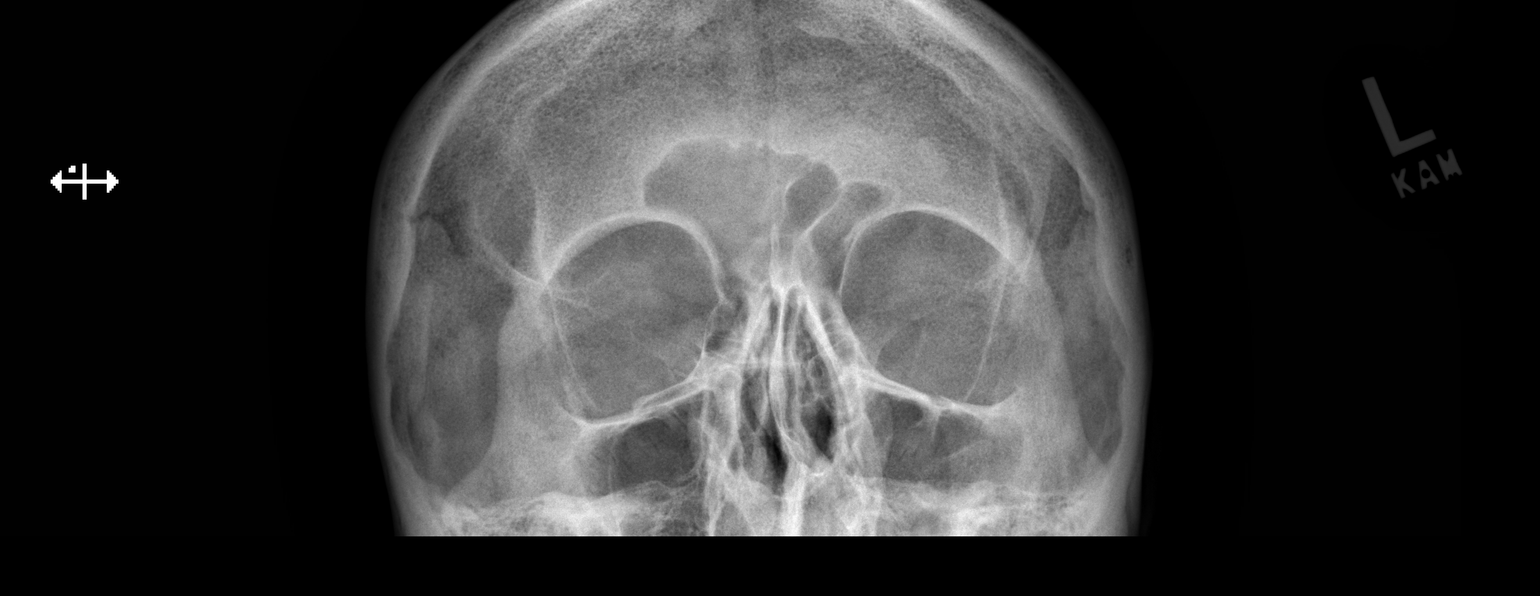

[w orbit pa (2 of 2)]
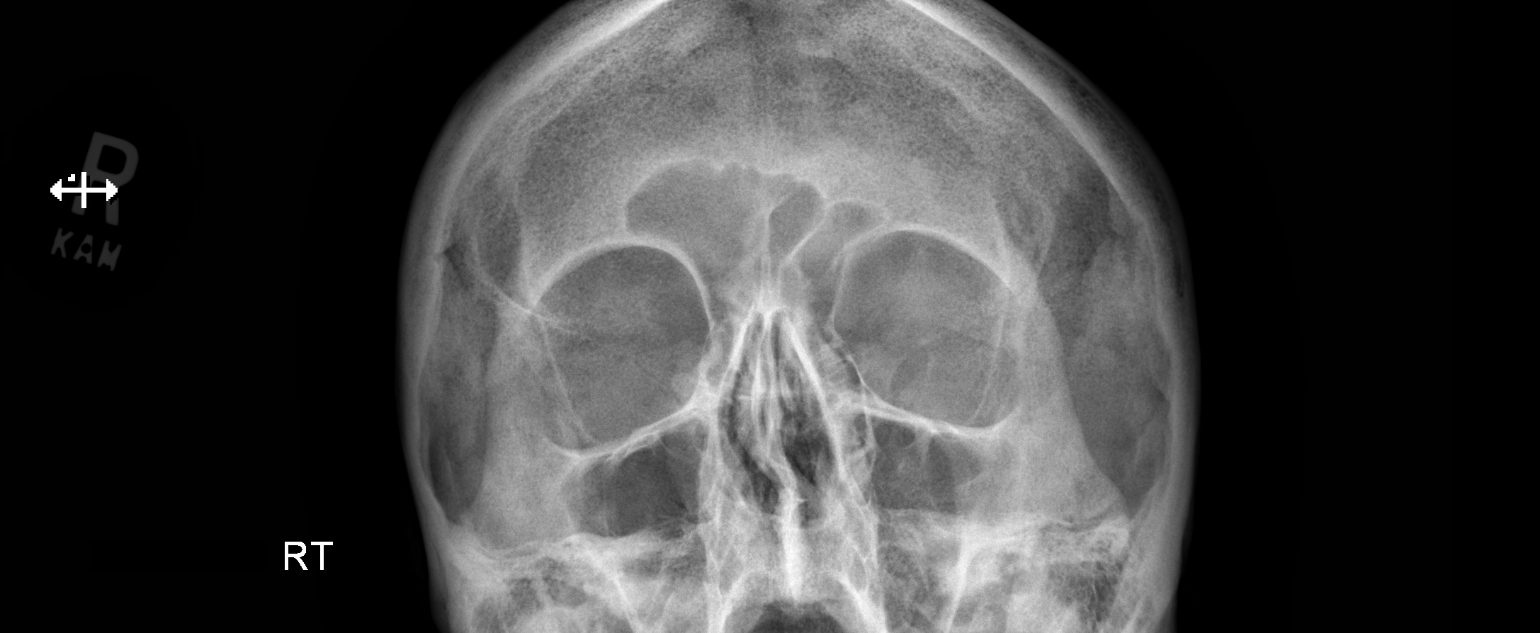

[2 of 2 positions shown; findings below may reference images not displayed]

FINDINGS: Water's views with eyes deviated toward the left and toward the
right were obtained. No intraorbital radiopaque foreign body.
Paranasal sinuses are clear. No fracture or dislocation. There is
rightward deviation of the nasal septum.
IMPRESSION: No evidence of metallic foreign body within the orbits.

## 2019-12-26 ENCOUNTER — Other Ambulatory Visit: Payer: Self-pay | Admitting: Family Medicine

## 2019-12-26 DIAGNOSIS — M48061 Spinal stenosis, lumbar region without neurogenic claudication: Secondary | ICD-10-CM

## 2020-02-13 ENCOUNTER — Other Ambulatory Visit: Payer: Self-pay | Admitting: Neurological Surgery

## 2020-03-13 ENCOUNTER — Emergency Department (HOSPITAL_COMMUNITY): Payer: Medicare Other

## 2020-03-13 ENCOUNTER — Encounter (HOSPITAL_COMMUNITY): Payer: Self-pay | Admitting: *Deleted

## 2020-03-13 ENCOUNTER — Other Ambulatory Visit: Payer: Self-pay

## 2020-03-13 ENCOUNTER — Observation Stay (HOSPITAL_COMMUNITY)
Admission: EM | Admit: 2020-03-13 | Discharge: 2020-03-14 | Disposition: A | Discharge status: Home / Self Care | Payer: Medicare Other | Attending: Internal Medicine | Admitting: Internal Medicine

## 2020-03-13 DIAGNOSIS — I10 Essential (primary) hypertension: Secondary | ICD-10-CM | POA: Insufficient documentation

## 2020-03-13 DIAGNOSIS — F17210 Nicotine dependence, cigarettes, uncomplicated: Secondary | ICD-10-CM | POA: Insufficient documentation

## 2020-03-13 DIAGNOSIS — Z79899 Other long term (current) drug therapy: Secondary | ICD-10-CM | POA: Insufficient documentation

## 2020-03-13 DIAGNOSIS — Z20822 Contact with and (suspected) exposure to covid-19: Secondary | ICD-10-CM | POA: Diagnosis not present

## 2020-03-13 DIAGNOSIS — M6281 Muscle weakness (generalized): Secondary | ICD-10-CM | POA: Insufficient documentation

## 2020-03-13 DIAGNOSIS — E785 Hyperlipidemia, unspecified: Secondary | ICD-10-CM | POA: Insufficient documentation

## 2020-03-13 DIAGNOSIS — M109 Gout, unspecified: Secondary | ICD-10-CM | POA: Insufficient documentation

## 2020-03-13 DIAGNOSIS — R29898 Other symptoms and signs involving the musculoskeletal system: Secondary | ICD-10-CM | POA: Diagnosis present

## 2020-03-13 DIAGNOSIS — F17200 Nicotine dependence, unspecified, uncomplicated: Secondary | ICD-10-CM | POA: Diagnosis present

## 2020-03-13 DIAGNOSIS — R200 Anesthesia of skin: Secondary | ICD-10-CM | POA: Diagnosis present

## 2020-03-13 DIAGNOSIS — G459 Transient cerebral ischemic attack, unspecified: Secondary | ICD-10-CM | POA: Diagnosis not present

## 2020-03-13 DIAGNOSIS — I639 Cerebral infarction, unspecified: Secondary | ICD-10-CM

## 2020-03-13 LAB — CBC
HCT: 42.2 % (ref 39.0–52.0)
Hemoglobin: 13.8 g/dL (ref 13.0–17.0)
MCH: 32.5 pg (ref 26.0–34.0)
MCHC: 32.7 g/dL (ref 30.0–36.0)
MCV: 99.5 fL (ref 80.0–100.0)
Platelets: 186 10*3/uL (ref 150–400)
RBC: 4.24 MIL/uL (ref 4.22–5.81)
RDW: 14.2 % (ref 11.5–15.5)
WBC: 7.5 10*3/uL (ref 4.0–10.5)
nRBC: 0 % (ref 0.0–0.2)

## 2020-03-13 LAB — COMPREHENSIVE METABOLIC PANEL
ALT: 22 U/L (ref 0–44)
AST: 36 U/L (ref 15–41)
Albumin: 4.6 g/dL (ref 3.5–5.0)
Alkaline Phosphatase: 44 U/L (ref 38–126)
Anion gap: 12 (ref 5–15)
BUN: 14 mg/dL (ref 8–23)
CO2: 28 mmol/L (ref 22–32)
Calcium: 8.9 mg/dL (ref 8.9–10.3)
Chloride: 94 mmol/L — ABNORMAL LOW (ref 98–111)
Creatinine, Ser: 1.08 mg/dL (ref 0.61–1.24)
GFR calc Af Amer: >60 mL/min (ref >60)
GFR calc non Af Amer: >60 mL/min (ref >60)
Glucose, Bld: 86 mg/dL (ref 70–99)
Potassium: 3.8 mmol/L (ref 3.5–5.1)
Sodium: 134 mmol/L — ABNORMAL LOW (ref 135–145)
Total Bilirubin: 0.7 mg/dL (ref 0.3–1.2)
Total Protein: 7.6 g/dL (ref 6.5–8.1)

## 2020-03-13 LAB — DIFFERENTIAL
Abs Immature Granulocytes: 0.02 10*3/uL (ref 0.00–0.07)
Basophils Absolute: 0.1 10*3/uL (ref 0.0–0.1)
Basophils Relative: 1 %
Eosinophils Absolute: 0.4 10*3/uL (ref 0.0–0.5)
Eosinophils Relative: 6 %
Immature Granulocytes: 0 %
Lymphocytes Relative: 25 %
Lymphs Abs: 1.9 10*3/uL (ref 0.7–4.0)
Monocytes Absolute: 0.6 10*3/uL (ref 0.1–1.0)
Monocytes Relative: 8 %
Neutro Abs: 4.5 10*3/uL (ref 1.7–7.7)
Neutrophils Relative %: 60 %

## 2020-03-13 LAB — APTT: aPTT: 36 seconds (ref 24–36)

## 2020-03-13 LAB — URINALYSIS, ROUTINE W REFLEX MICROSCOPIC
Bilirubin Urine: NEGATIVE
Glucose, UA: NEGATIVE mg/dL
Hgb urine dipstick: NEGATIVE
Ketones, ur: NEGATIVE mg/dL
Leukocytes,Ua: NEGATIVE
Nitrite: NEGATIVE
Protein, ur: NEGATIVE mg/dL
Specific Gravity, Urine: 1.011 (ref 1.005–1.030)
pH: 7 (ref 5.0–8.0)

## 2020-03-13 LAB — RAPID URINE DRUG SCREEN, HOSP PERFORMED
Amphetamines: NOT DETECTED
Barbiturates: NOT DETECTED
Benzodiazepines: NOT DETECTED
Cocaine: NOT DETECTED
Opiates: NOT DETECTED
Tetrahydrocannabinol: NOT DETECTED

## 2020-03-13 LAB — PROTIME-INR
INR: 1 (ref 0.8–1.2)
Prothrombin Time: 13 seconds (ref 11.4–15.2)

## 2020-03-13 LAB — ETHANOL: Alcohol, Ethyl (B): <10 mg/dL (ref <10)

## 2020-03-13 LAB — SARS CORONAVIRUS 2 BY RT PCR (HOSPITAL ORDER, PERFORMED IN ~~LOC~~ HOSPITAL LAB): SARS Coronavirus 2: NEGATIVE

## 2020-03-13 IMAGING — MR MR HEAD W/O CM
8 of 10 series · 37 of 48 positions shown · non-contrast
Comparison: Prior CT from earlier same day.

CLINICAL DATA: Initial evaluation for numbness of left face and
left upper extremity.

EXAM:
MRI HEAD WITHOUT CONTRAST
TECHNIQUE: Multiplanar, multiecho pulse sequences of the brain and surrounding
structures were obtained without intravenous contrast.

[Series 2: T1 · sagittal · 5.0mm · 0.46mm/px · 2 of 23 slices shown (1 of 2)]
[im 1/23]
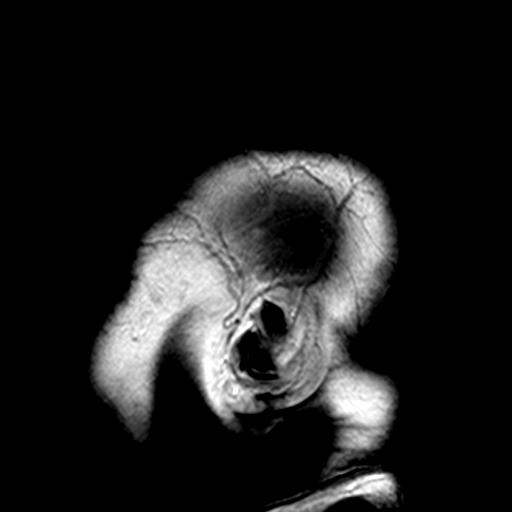
[im 23/23]
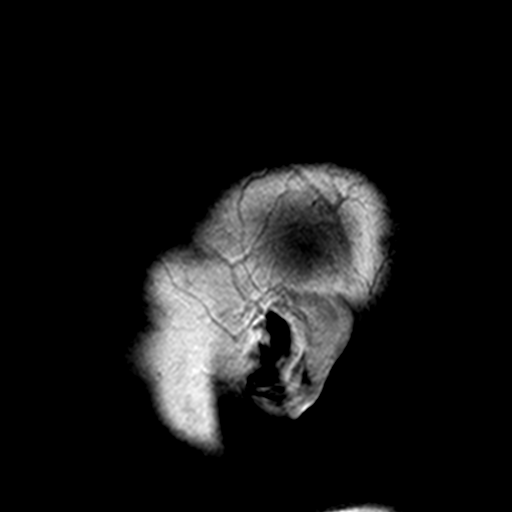

[Series 3: DWI · axial · 3.0mm · 0.82mm/px · z∈[-89,+73]mm · 11 of 110 slices shown (1 of 2)]
[im 1/110]
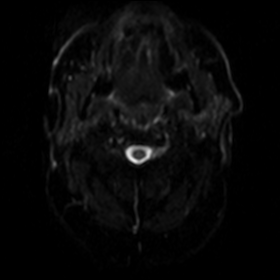
[im 11/110]
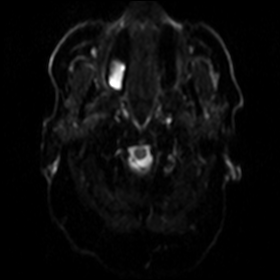
[im 22/110]
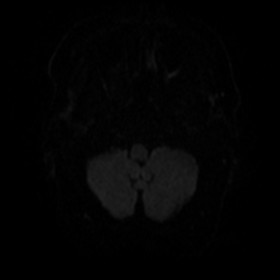
[im 33/110]
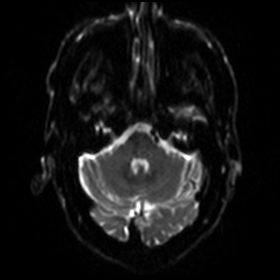
[im 44/110]
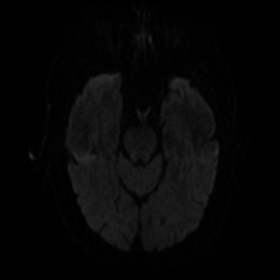
[im 55/110]
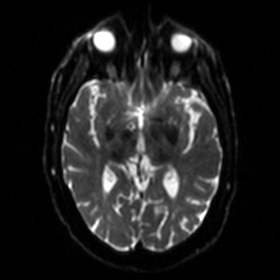
[im 66/110]
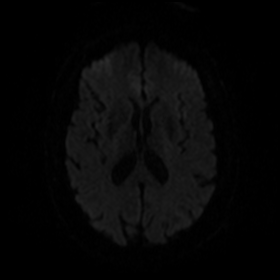
[im 77/110]
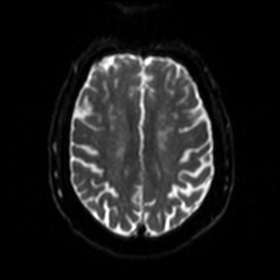
[im 88/110]
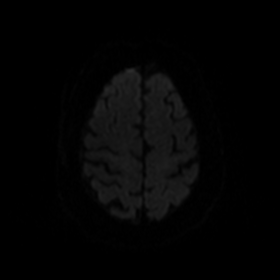
[im 99/110]
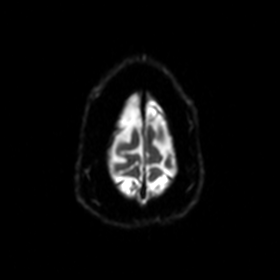
[im 110/110]
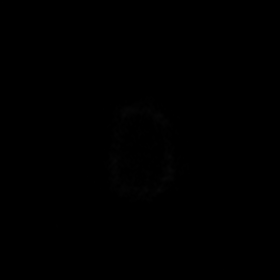

[Series 5: DWI · coronal · 5.0mm · 0.55mm/px · 7 of 71 slices shown (2 of 2)]
[im 1/71]
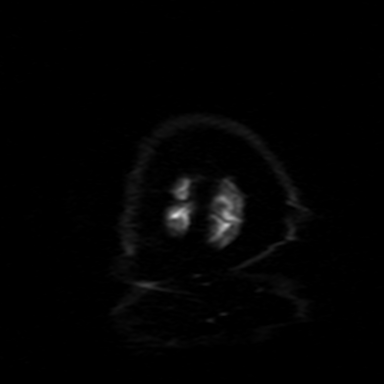
[im 12/71]
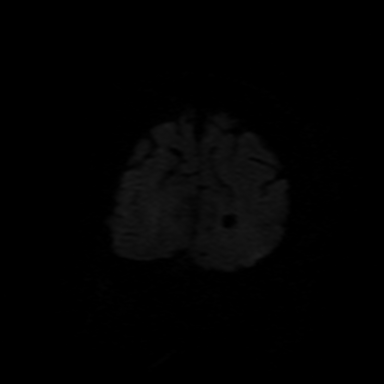
[im 24/71]
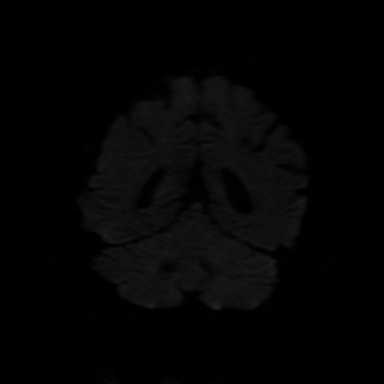
[im 36/71]
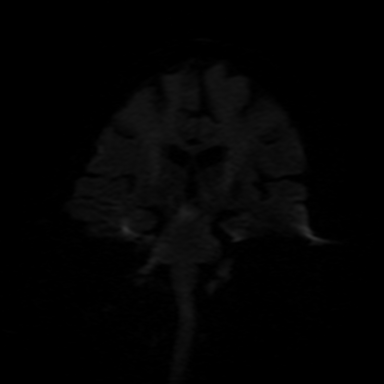
[im 47/71]
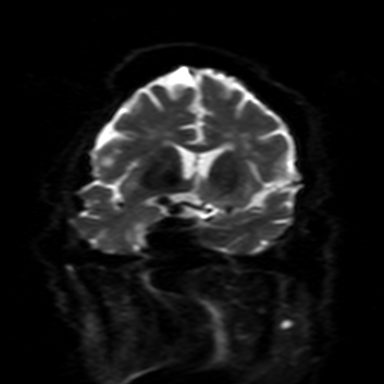
[im 59/71]
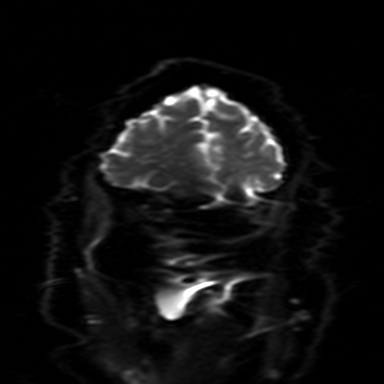
[im 71/71]
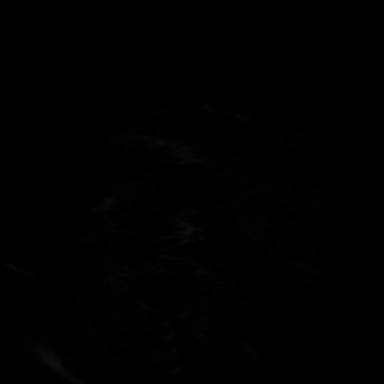

[Series 7: T2 · axial · 5.0mm · 0.70mm/px · z∈[-84,+71]mm · 2 of 25 slices shown (1 of 3)]
[im 1/25]
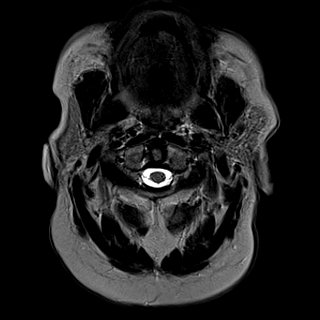
[im 25/25]
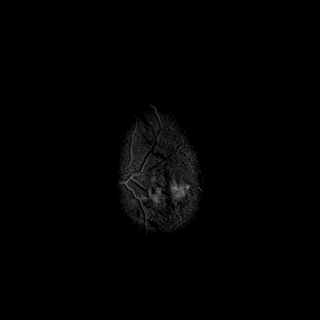

[Series 8: FLAIR · axial · 3.0mm · 0.88mm/px · z∈[-77,+64]mm · 5 of 48 slices shown]
[im 1/48]
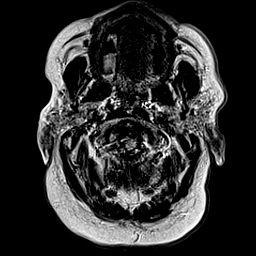
[im 12/48]
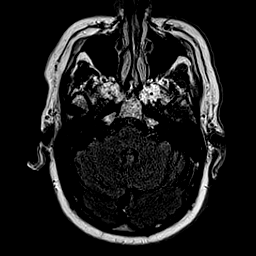
[im 24/48]
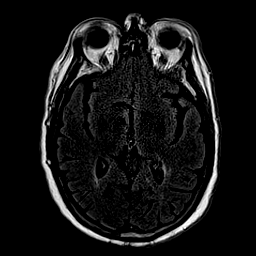
[im 36/48]
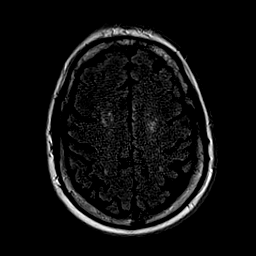
[im 48/48]
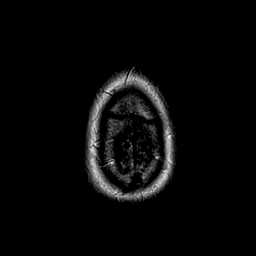

[Series 9: T2 · axial · 5.0mm · 0.44mm/px · z∈[-78,+65]mm · 2 of 23 slices shown (2 of 3)]
[im 1/23]
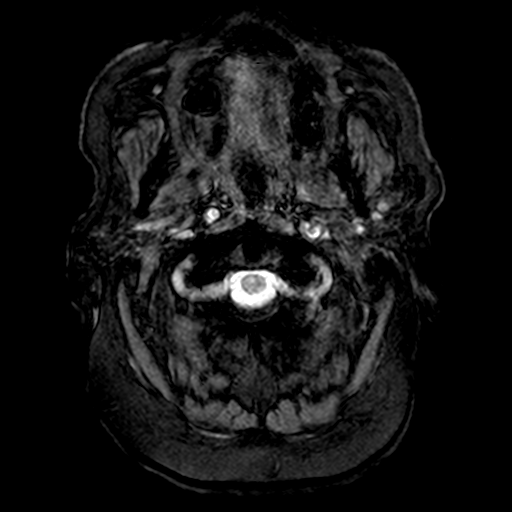
[im 23/23]
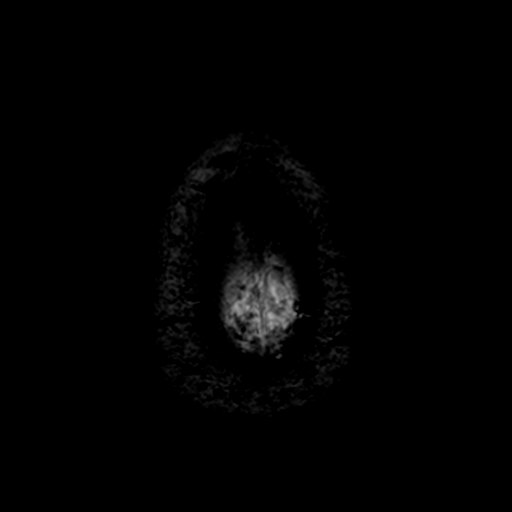

[Series 10: T1 · axial · 2.0mm · 0.44mm/px · z∈[-85,+3]mm · 5 of 79 slices shown (2 of 2)]
[im 1/79]
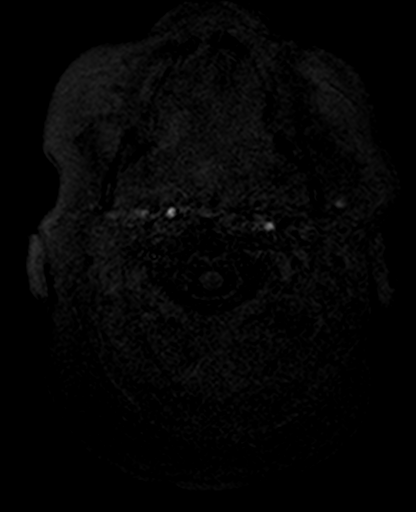
[im 12/79]
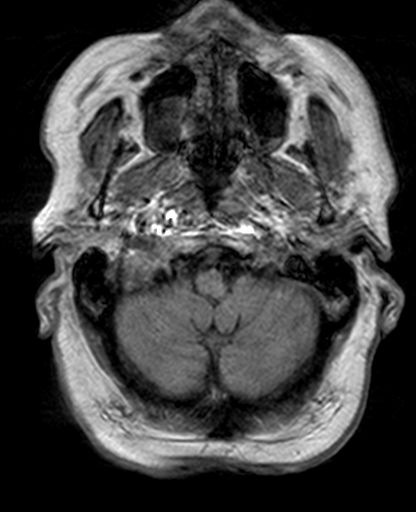
[im 23/79]
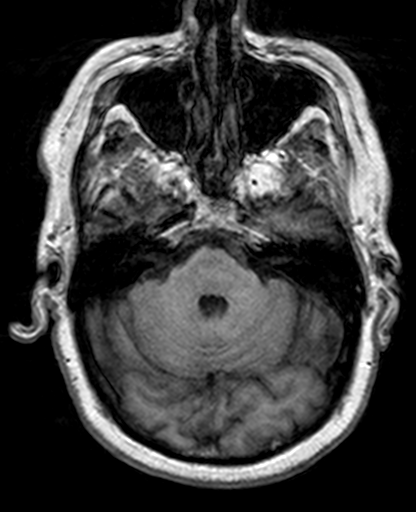
[im 34/79]
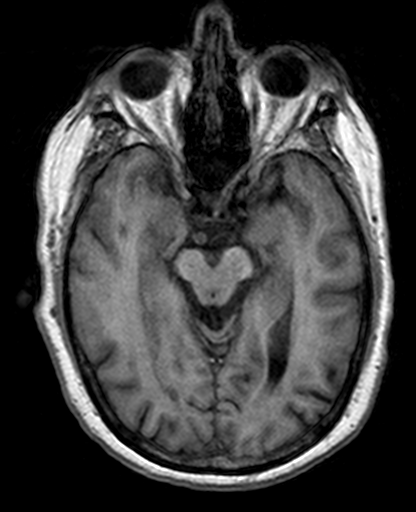
[im 45/79]
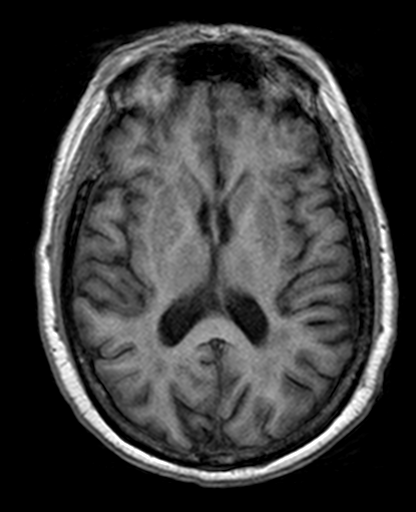

[Series 11: T2 · coronal · 5.0mm · 0.60mm/px · 3 of 28 slices shown (3 of 3)]
[im 1/28]
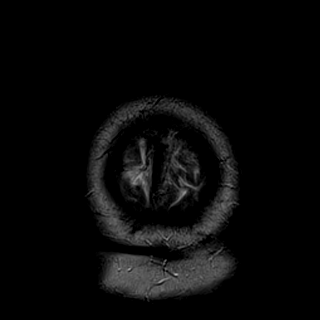
[im 14/28]
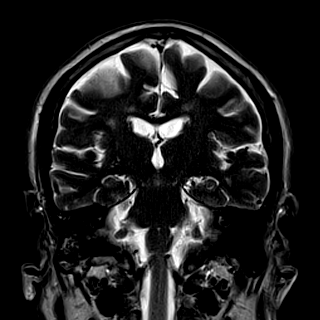
[im 28/28]
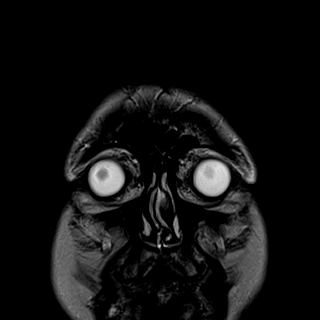

[37 of 48 positions shown; findings below may reference images not displayed]

FINDINGS: Brain: Mild age-related cerebral atrophy. Patchy T2/FLAIR
hyperintensity within the periventricular and deep white matter both
cerebral hemispheres most consistent with chronic small vessel
ischemic disease, mild in nature.

No abnormal foci of restricted diffusion to suggest acute or
subacute ischemia. Gray-white matter differentiation maintained. No
encephalomalacia to suggest chronic cortical infarction. No foci of
susceptibility artifact to suggest acute or chronic intracranial
hemorrhage.

No mass lesion, midline shift or mass effect. No hydrocephalus or
extra-axial fluid collection. Pituitary gland suprasellar region
normal. Midline structures intact.

Vascular: Major intracranial vascular flow voids are maintained.

Skull and upper cervical spine: Craniocervical junction within
normal limits. Upper cervical spine normal. Bone marrow signal
intensity within normal limits. No scalp soft tissue abnormality.

Sinuses/Orbits: Globes and orbital soft tissues within normal
limits. Mucosal thickening and/or retention cyst present within the
maxillary sinuses. Paranasal sinuses are otherwise clear. Trace
right mastoid effusion noted, of doubtful significance. Inner ear
structures grossly normal.

Other: None.
IMPRESSION: 1. No acute intracranial abnormality.
2. Mild age-related cerebral atrophy with chronic small vessel
ischemic disease.

## 2020-03-13 IMAGING — CT CT HEAD W/O CM
3 series · 16 of 47 positions shown, 19 images · non-contrast
Comparison: None.

CLINICAL DATA: Left leg weakness this morning at 2 a.m., left upper
extremity and left-sided facial numbness at 1 p.m., last known well
11 p.m. yesterday

EXAM:
CT HEAD WITHOUT CONTRAST
TECHNIQUE: Contiguous axial images were obtained from the base of the skull
through the vertex without intravenous contrast.

[Series 2: head w o · axial · 0.46mm/px · z∈[+18,+153]mm · 10 of 33 slices shown, 13 images]
[im 3/33  brain]
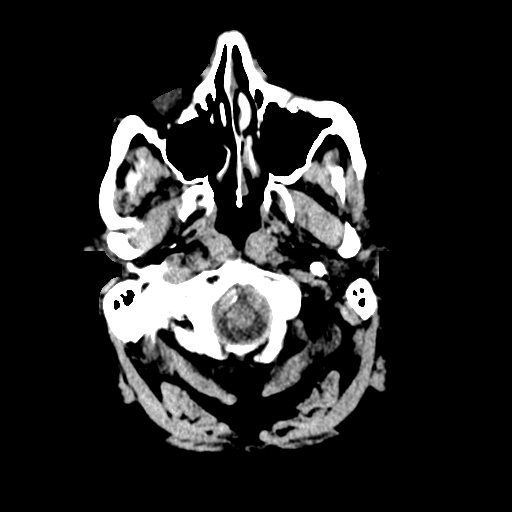
[im 3/33  bone]
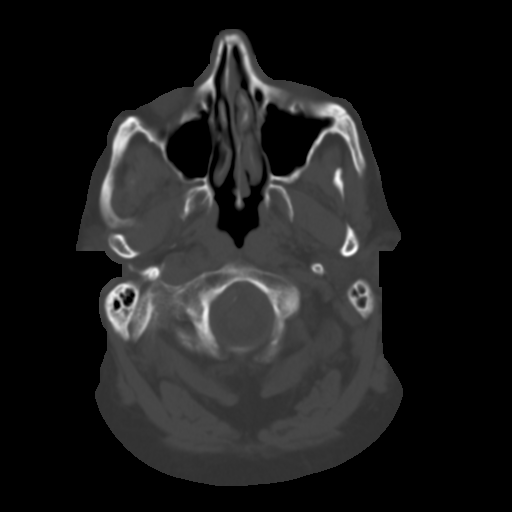
[im 6/33  brain]
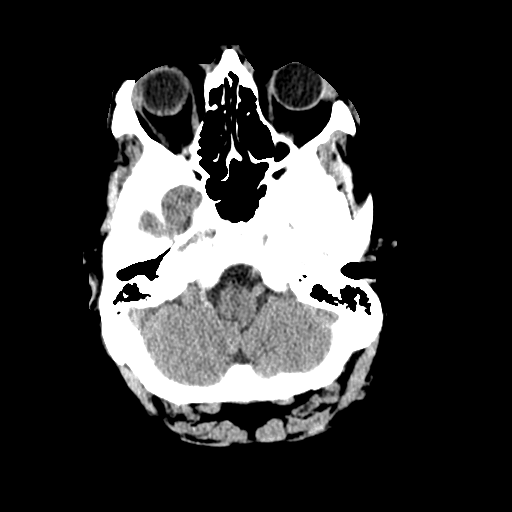
[im 9/33  brain]
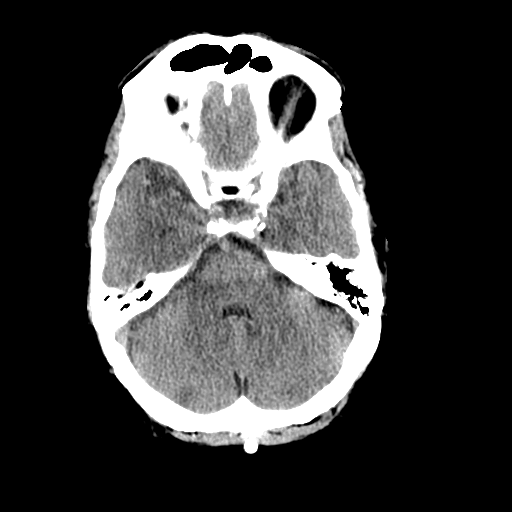
[im 12/33  brain]
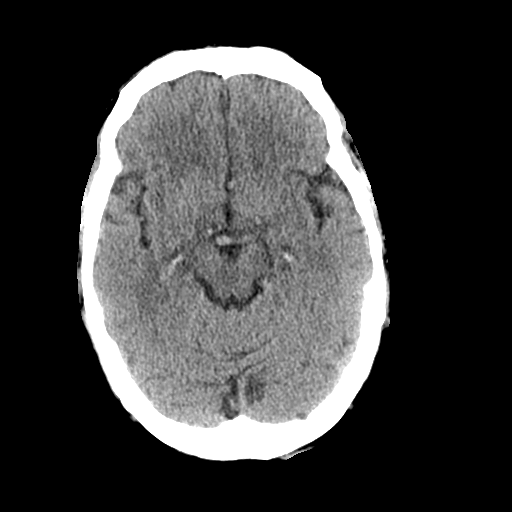
[im 15/33  brain]
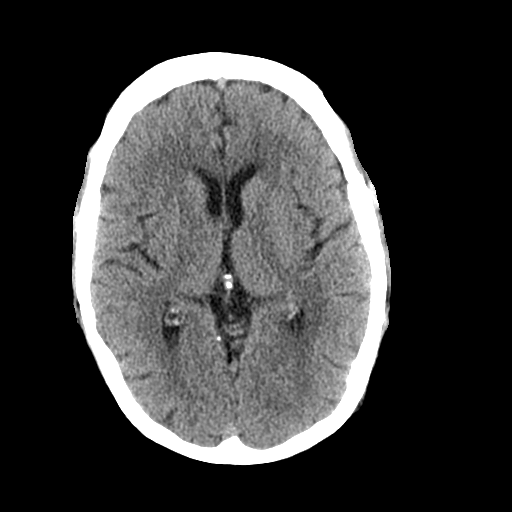
[im 15/33  bone]
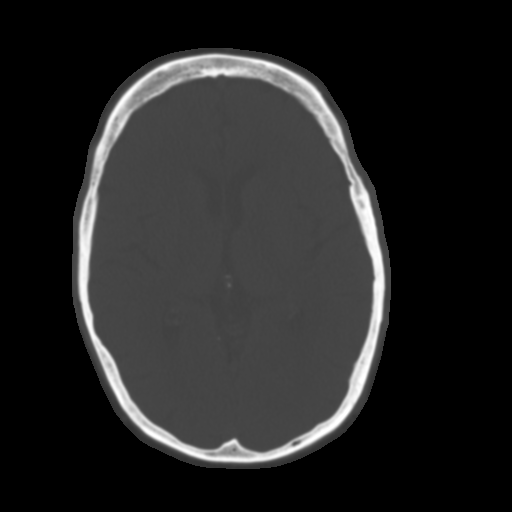
[im 18/33  brain]
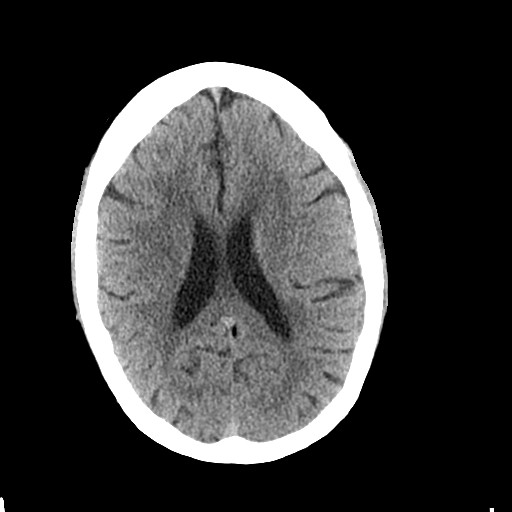
[im 21/33  brain]
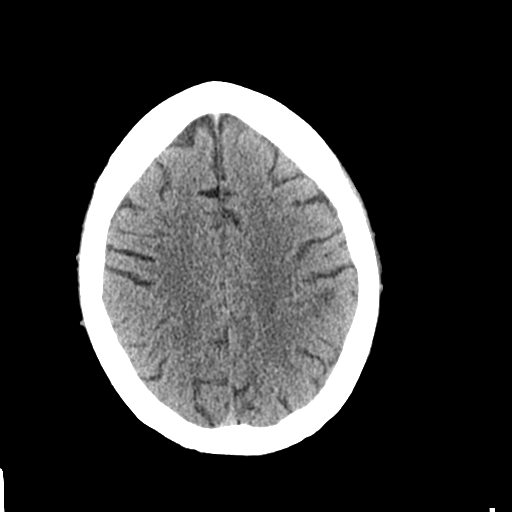
[im 25/33  brain]
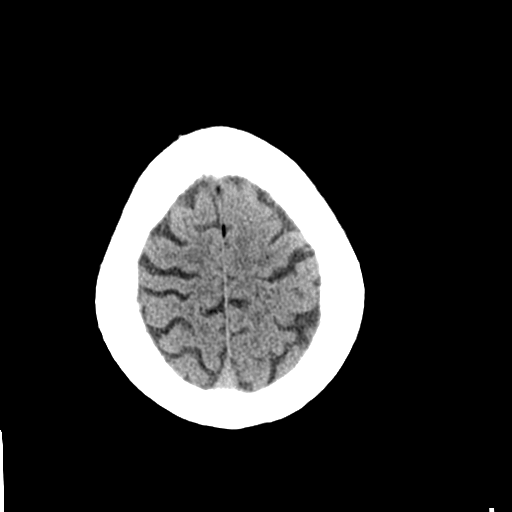
[im 27/33  brain]
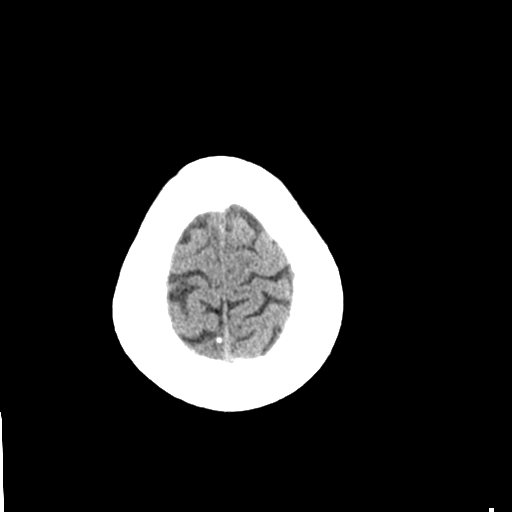
[im 27/33  bone]
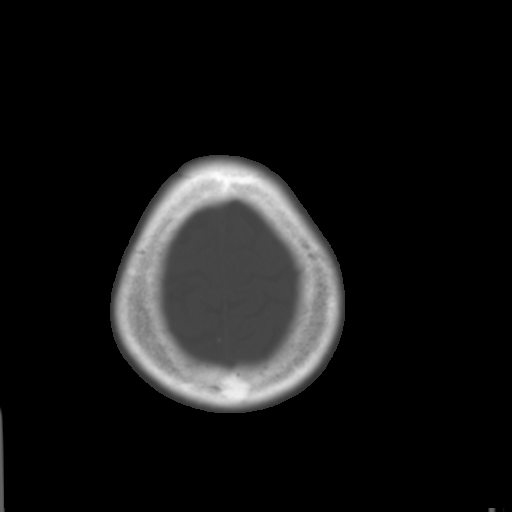
[im 30/33  brain]
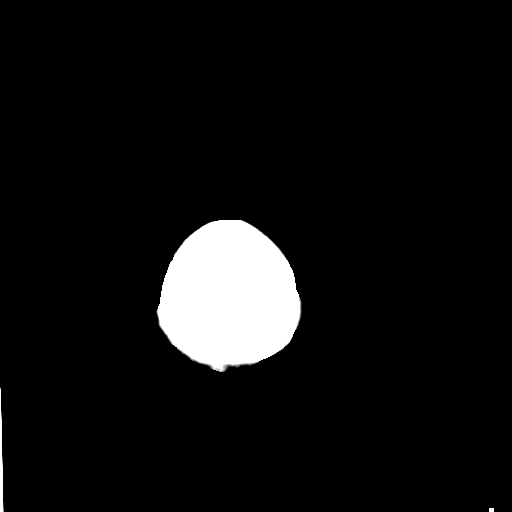

[Series 4: coronal soft · coronal · 0.33mm/px · 3 of 74 slices shown]
[im 25/74  brain]
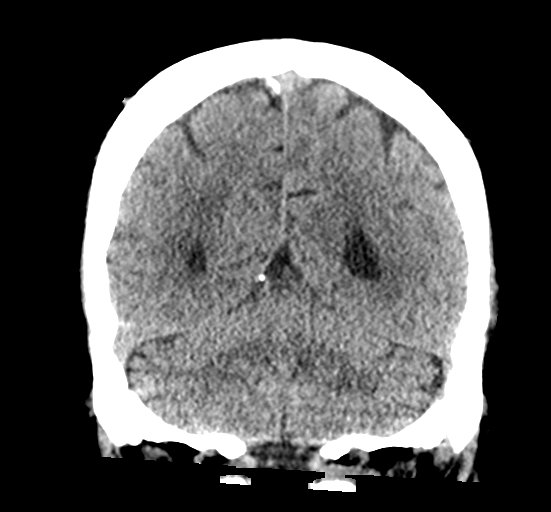
[im 33/74  brain]
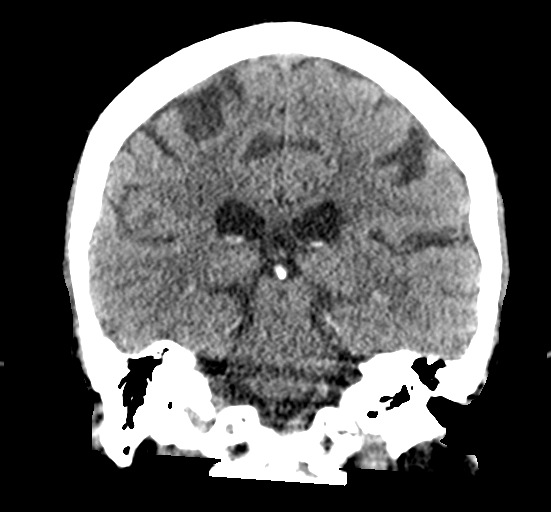
[im 41/74  brain]
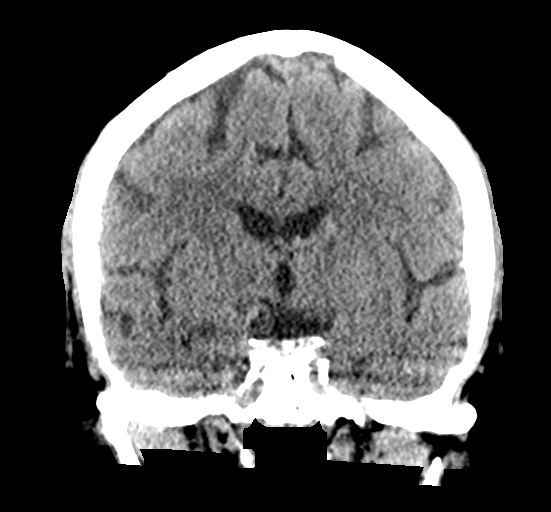

[Series 5: sagittal soft · sagittal · 0.33mm/px · 3 of 58 slices shown]
[im 20/58  brain]
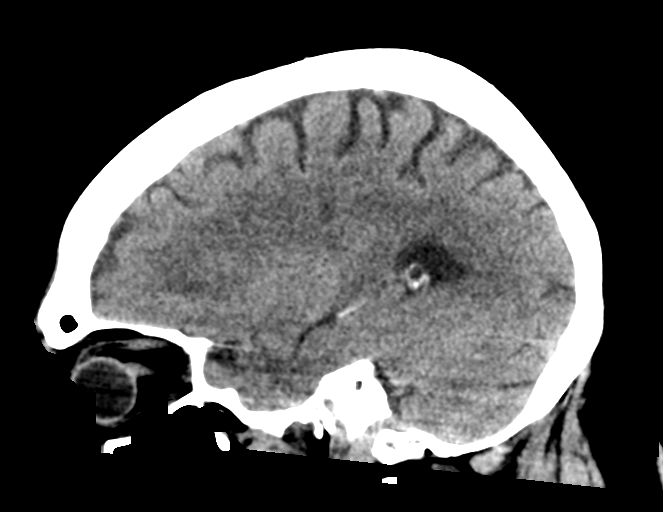
[im 29/58  brain]
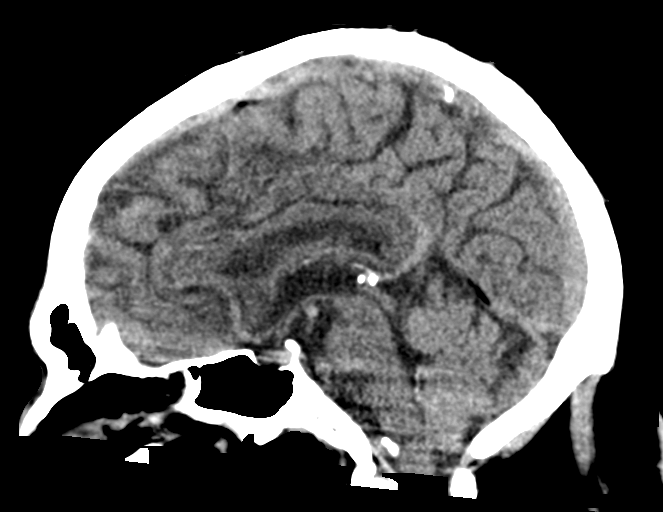
[im 39/58  brain]
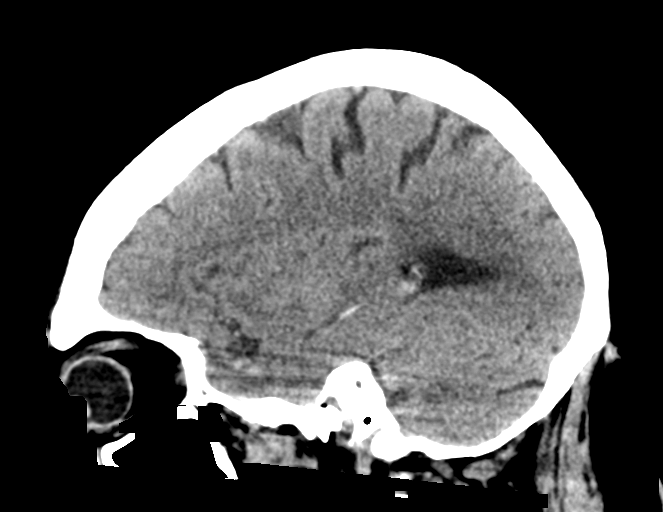

[16 of 47 positions shown; findings below may reference images not displayed]

FINDINGS: Brain: No acute infarct or hemorrhage. Lateral ventricles and
midline structures are unremarkable. No acute extra-axial fluid
collections. No mass effect.

Vascular: No hyperdense vessel or unexpected calcification.

Skull: Normal. Negative for fracture or focal lesion.

Sinuses/Orbits: No acute finding.

Other: None.
IMPRESSION: 1. No acute intracranial process.

## 2020-03-13 MED ORDER — SODIUM CHLORIDE 0.9 % IV SOLN
100.0000 mL/h | INTRAVENOUS | Status: DC
  Administered 2020-03-13 – 2020-03-14 (×2): 100 mL/h via INTRAVENOUS

## 2020-03-13 MED ORDER — ACETAMINOPHEN 650 MG RE SUPP: 650.0000 mg | RECTAL | Status: DC | PRN

## 2020-03-13 MED ORDER — ENOXAPARIN SODIUM 40 MG/0.4ML ~~LOC~~ SOLN
40.0000 mg | SUBCUTANEOUS | Status: DC
  Administered 2020-03-14: 40 mg via SUBCUTANEOUS
  Filled 2020-03-13: qty 0.4

## 2020-03-13 MED ORDER — SENNOSIDES-DOCUSATE SODIUM 8.6-50 MG PO TABS
1.0000 | ORAL_TABLET | Freq: Every evening | ORAL | Status: DC | PRN
  Filled 2020-03-13: qty 1

## 2020-03-13 MED ORDER — SODIUM CHLORIDE 0.9 % IV BOLUS
500.0000 mL | Freq: Once | INTRAVENOUS | Status: AC
  Administered 2020-03-13: 500 mL via INTRAVENOUS

## 2020-03-13 MED ORDER — ACETAMINOPHEN 160 MG/5ML PO SOLN: 650.0000 mg | ORAL | Status: DC | PRN

## 2020-03-13 MED ORDER — LORAZEPAM 0.5 MG PO TABS: 0.5000 mg | ORAL_TABLET | Freq: Once | ORAL | Status: DC

## 2020-03-13 MED ORDER — AMLODIPINE BESYLATE 5 MG PO TABS
5.0000 mg | ORAL_TABLET | Freq: Once | ORAL | Status: AC
  Administered 2020-03-13: 5 mg via ORAL
  Filled 2020-03-13: qty 1

## 2020-03-13 MED ORDER — STROKE: EARLY STAGES OF RECOVERY BOOK
Freq: Once | Status: AC
  Filled 2020-03-13: qty 1

## 2020-03-13 MED ORDER — ACETAMINOPHEN 325 MG PO TABS: 650.0000 mg | ORAL_TABLET | ORAL | Status: DC | PRN

## 2020-03-13 MED ORDER — LORAZEPAM 2 MG/ML IJ SOLN
1.0000 mg | Freq: Once | INTRAMUSCULAR | Status: AC
  Administered 2020-03-13: 1 mg via INTRAVENOUS
  Filled 2020-03-13: qty 1

## 2020-03-13 NOTE — ED Triage Notes (Signed)
Pt's wife reports last night at 0200 pt got up to go to the bathroom and he couldn't move his left leg. Wife reports today around 1300 pt c/o numbness to left arm/fingers and left side of face. Wife reports pt is dragging his left leg. LKW 2300 yesterday.   Dr. Lynelle Doctor notified of pt's symptoms in triage.

## 2020-03-13 NOTE — ED Notes (Signed)
MRI arrived to transport pt.

## 2020-03-13 NOTE — Consult Note (Signed)
[facility] Consult Services  Stat Consult  Date of Service:   03/13/2020 21:09:32  Impression:     .  G45.9 - Transient cerebral ischemic attack, unspecified  Comments/Sign-Out: The patient is a 69 year old man with a history of hypertension, hyperlipidemia, diabetes, who presents with transient left arm, leg, and face weakness/numbness, felt most likely to represent TIA. MRI brain already performed and negative for infarct. Recommend evaluation of vascular risk factors with MRA head/neck, TTE, lipid panel, HgbA1C. Tolerate permissive hypertension with BP goal < 185/110. Continue ASA 81 mg, may consider dual antiplatelet as indicated by results of workup.  CT HEAD: Reviewed MRI BRAIN 1. No acute intracranial abnormality. 2. Mild age-related cerebral atrophy with chronic small vessel ischemic disease.  Metrics: [facility] Notification Time: 03/13/2020 21:07:35 Stamp Time: 03/13/2020 21:09:32 Callback Response Time: 03/13/2020 21:12:00  Our recommendations are outlined below.  Recommendations:     .  Initiate Aspirin 81 MG Daily  Imaging Studies:     Marland Kitchen  MRA Head Without Contrast     .  MRA Neck with and Without Contrast     .  Echocardiogram - Transthoracic Echocardiogram  Disposition: Neurology Follow Up Recommended  Sign Out:     .  Discussed with Emergency Department Provider  ----------------------------------------------------------------------------------------------------  Chief Complaint: left leg weakness  History of Present Illness: Patient is a 69 year old Male.  Patient presents with transient weakness and numbness over the left side. Symptoms started last night when he got out of bed to use the bathroom. He fell to the ground because his left leg was weak. Ambulated to bathroom, felt like his leg was dragging. Rested for few hours, and symptoms resolved. This afternoon, at 2:30 PM, he had numbness over the left face  and arm. He felt like his left leg was getting weaker again. Resolved after 1 hour. In ER, gait is antalgic, at baseline. Past medical history includes gout, spinal stenosis. Laminectomy planned in June. Ambulates with cane for all distances.   Past Medical History:     . Hypertension     . Diabetes Mellitus     . Hyperlipidemia     . There is NO history of Stroke  Anticoagulant use:  No  Antiplatelet use: ASA 81 mg    Examination: BP(155/144), Pulse(76), Blood Glucose(86) 1A: Level of Consciousness - Alert; keenly responsive + 0 1B: Ask Month and Age - Both Questions Right + 0 1C: Blink Eyes & Squeeze Hands - Performs Both Tasks + 0 2: Test Horizontal Extraocular Movements - Normal + 0 3: Test Visual Fields - No Visual Loss + 0 4: Test Facial Palsy (Use Grimace if Obtunded) - Normal symmetry + 0 5A: Test Left Arm Motor Drift - No Drift for 10 Seconds + 0 5B: Test Right Arm Motor Drift - No Drift for 10 Seconds + 0 6A: Test Left Leg Motor Drift - No Drift for 5 Seconds + 0 6B: Test Right Leg Motor Drift - No Drift for 5 Seconds + 0 7: Test Limb Ataxia (FNF/Heel-Shin) - No Ataxia + 0 8: Test Sensation - Normal; No sensory loss + 0 9: Test Language/Aphasia - Normal; No aphasia + 0 10: Test Dysarthria - Mild-Moderate Dysarthria: Slurring but can be understood + 1 11: Test Extinction/Inattention - No abnormality + 0  NIHSS Score: 1    Patient is being evaluated for possible acute neurologic impairment and high probability of imminent or life-threatening deterioration. I spent total of 30 minutes  providing care to this patient, including time for face to face visit via telemedicine, review of medical records, imaging studies and discussion of findings with providers, the patient and/or family.   Dr Serita Grammes   [facility] [phone]  Case [id number]

## 2020-03-13 NOTE — ED Provider Notes (Signed)
[facility] Provider Note   CSN: [id number] Arrival date & time: 03/13/20  1532     History Chief Complaint  Patient presents with  . Numbness    Timothy Hester is a 69 y.o. male.  HPI   Patient presents to the emergency room for evaluation of numbness and weakness.  Patient has a history of back problems.  It is causing significant difficulty with his ability to walk.  Patient is scheduled for a bilateral laminectomy surgery in June.  Patient states this morning he tried to get up to go to the bathroom about 2 AM and he was having difficulty moving his left leg.  He was dragging it.  Patient was able to eventually get up and walk around in the morning.  He was out doing errands with his wife when he started experiencing numbness in his left face and arm.  Patient's last known well was about 11 PM yesterday.  Right now the symptoms are better.  Patient talked to her family member who is a Marine scientist and they recommend evaluation in the ED.  Patient denies any headache.  No fevers or chills.  No chest pain or shortness of breath.  Past Medical History:  Diagnosis Date  . Arthritis   . Gout   . Hyperglycemia   . Hyperlipidemia   . Hypertension   . Lower back pain   . Rheumatic fever    History of  . Smoker     Patient Active Problem List   Diagnosis Date Noted  . Advance care planning 11/06/2019  . Gout   . Arthritis   . Smoker   . Lower back pain   . Hypertension   . Hyperlipidemia   . Hyperglycemia     Past Surgical History:  Procedure Laterality Date  . APPENDECTOMY    . TONSILLECTOMY         Family History  Problem Relation Age of Onset  . Colon cancer Neg Hx   . Prostate cancer Neg Hx     Social History   Tobacco Use  . Smoking status: Current Every Day Smoker    Packs/day: 1.00    Years: 50.00    Pack years: 50.00    Types: Cigarettes  . Smokeless tobacco: Never Used  Substance Use Topics  . Alcohol use: Never  . Drug use:  Never    Home Medications Prior to Admission medications   Medication Sig Start Date End Date Taking? Authorizing Provider  allopurinol (ZYLOPRIM) 300 MG tablet Take 300 mg by mouth daily.   Yes [provider]  aspirin EC 81 MG tablet Take 81 mg by mouth daily.   Yes [provider]  atorvastatin (LIPITOR) 20 MG tablet Take 20 mg by mouth daily.   Yes [provider]  losartan (COZAAR) 50 MG tablet Take 50 mg by mouth daily. .   Yes [provider]  Menthol, Topical Analgesic, (BIOFREEZE EX) Apply 1 application topically daily as needed (for back pain).   Yes [provider]  metFORMIN (GLUCOPHAGE-XR) 500 MG 24 hr tablet Take 500 mg by mouth daily.  02/26/20  Yes [provider]    Allergies    Erythromycin  Review of Systems   Review of Systems  All other systems reviewed and are negative.   Physical Exam Updated Vital Signs BP (!) 170/115   Pulse 73   Temp 98.3 F (36.8 C) (Oral)   Resp 15   Ht 1.727 m (  5\' 8" )   Wt 99.8 kg   SpO2 95%   BMI 33.45 kg/m   Physical Exam Vitals and nursing note reviewed.  Constitutional:      General: He is not in acute distress.    Appearance: He is well-developed.  HENT:     Head: Normocephalic and atraumatic.     Right Ear: External ear normal.     Left Ear: External ear normal.  Eyes:     General: No scleral icterus.       Right eye: No discharge.        Left eye: No discharge.     Conjunctiva/sclera: Conjunctivae normal.  Neck:     Trachea: No tracheal deviation.  Cardiovascular:     Rate and Rhythm: Normal rate and regular rhythm.  Pulmonary:     Effort: Pulmonary effort is normal. No respiratory distress.     Breath sounds: Normal breath sounds. No stridor. No wheezing or rales.  Abdominal:     General: Bowel sounds are normal. There is no distension.     Palpations: Abdomen is soft.     Tenderness: There is no abdominal tenderness. There is no guarding or rebound.    Musculoskeletal:        General: No tenderness.     Cervical back: Neck supple.  Skin:    General: Skin is warm and dry.     Findings: No rash.  Neurological:     Mental Status: He is alert and oriented to person, place, and time.     Cranial Nerves: No cranial nerve deficit (No facial droop, extraocular movements intact, tongue midline ).     Sensory: No sensory deficit.     Motor: No abnormal muscle tone or seizure activity.     Coordination: Coordination normal.     Comments: No pronator drift bilateral upper extrem, able to hold both legs off bed for 5 seconds, sensation intact in all extremities, no visual field cuts, no left or right sided neglect, normal finger-nose exam bilaterally, no nystagmus noted      ED Results / Procedures / Treatments   Labs (all labs ordered are listed, but only abnormal results are displayed) Labs Reviewed  COMPREHENSIVE METABOLIC PANEL - Abnormal; Notable for the following components:      Result Value   Sodium 134 (*)    Chloride 94 (*)    All other components within normal limits  ETHANOL  PROTIME-INR  APTT  CBC  DIFFERENTIAL  RAPID URINE DRUG SCREEN, HOSP PERFORMED  URINALYSIS, ROUTINE W REFLEX MICROSCOPIC    EKG EKG Interpretation  Date/Time:  Wednesday Mar 13 2020 20:18:20 EDT Ventricular Rate:  73 PR Interval:    QRS Duration: 108 QT Interval:  439 QTC Calculation: 478 R Axis:   58 Text Interpretation: Sinus rhythm Prolonged PR interval Low voltage, precordial leads Nonspecific T abnormalities, lateral leads Borderline prolonged QT interval No significant change since last tracing Confirmed by 09-07-1980 [phone]) on 03/13/2020 8:38:03 PM   Radiology CT HEAD WO CONTRAST  Result Date: 03/13/2020 CLINICAL DATA:  Left leg weakness this morning at 2 a.m., left upper extremity and left-sided facial numbness at 1 p.m., last known well 11 p.m. yesterday EXAM: CT HEAD WITHOUT CONTRAST TECHNIQUE: Contiguous axial images were obtained  from the base of the skull through the vertex without intravenous contrast. COMPARISON:  None. FINDINGS: Brain: No acute infarct or hemorrhage. Lateral ventricles and midline structures are unremarkable. No acute extra-axial fluid collections. No mass effect.  Vascular: No hyperdense vessel or unexpected calcification. Skull: Normal. Negative for fracture or focal lesion. Sinuses/Orbits: No acute finding. Other: None. IMPRESSION: 1. No acute intracranial process. Electronically Signed   By: Sharlet Salina M.D.   On: 03/13/2020 18:00   MR BRAIN WO CONTRAST  Result Date: 03/13/2020 CLINICAL DATA:  Initial evaluation for numbness of left face and left upper extremity. EXAM: MRI HEAD WITHOUT CONTRAST TECHNIQUE: Multiplanar, multiecho pulse sequences of the brain and surrounding structures were obtained without intravenous contrast. COMPARISON:  Prior CT from earlier same day. FINDINGS: Brain: Mild age-related cerebral atrophy. Patchy T2/FLAIR hyperintensity within the periventricular and deep white matter both cerebral hemispheres most consistent with chronic small vessel ischemic disease, mild in nature. No abnormal foci of restricted diffusion to suggest acute or subacute ischemia. Gray-white matter differentiation maintained. No encephalomalacia to suggest chronic cortical infarction. No foci of susceptibility artifact to suggest acute or chronic intracranial hemorrhage. No mass lesion, midline shift or mass effect. No hydrocephalus or extra-axial fluid collection. Pituitary gland suprasellar region normal. Midline structures intact. Vascular: Major intracranial vascular flow voids are maintained. Skull and upper cervical spine: Craniocervical junction within normal limits. Upper cervical spine normal. Bone marrow signal intensity within normal limits. No scalp soft tissue abnormality. Sinuses/Orbits: Globes and orbital soft tissues within normal limits. Mucosal thickening and/or retention cyst present within the  maxillary sinuses. Paranasal sinuses are otherwise clear. Trace right mastoid effusion noted, of doubtful significance. Inner ear structures grossly normal. Other: None. IMPRESSION: 1. No acute intracranial abnormality. 2. Mild age-related cerebral atrophy with chronic small vessel ischemic disease. Electronically Signed   By: Rise Mu M.D.   On: 03/13/2020 20:35    Procedures Procedures (including critical care time)  Medications Ordered in ED Medications  sodium chloride 0.9 % bolus 500 mL (0 mLs Intravenous Stopped 03/13/20 1834)    Followed by  0.9 %  sodium chloride infusion (100 mL/hr Intravenous New Bag/Given 03/13/20 1835)  LORazepam (ATIVAN) injection 1 mg (1 mg Intravenous Given 03/13/20 1857)    ED Course  I have reviewed the triage vital signs and the nursing notes.  Pertinent labs & imaging results that were available during my care of the patient were reviewed by me and considered in my medical decision making (see chart for details).  Clinical Course as of Mar 13 2226  Wed Mar 13, 2020  1652 Discussed doing an MRI.  Patient states he has severe claustrophobia and does not want to have that done.  Explained to him that cannot exclude the possibility of stroke without doing that procedure.  He agrees to a CT scan at this point   [JK]  1809 Normal  CBC [JK]  1809 Normal  CT HEAD WO CONTRAST [JK]  1809 Essentially normal  Comprehensive metabolic panel(!) [JK]  1838 Discussed MRI with patient.  He agrees to try at this point   [JK]  2049 MRI without acute findings.  No def stroke.     [JK]  2159 Teleneurology consult provided.  Recommend admission for TIA workup   [JK]    Clinical Course User Index [JK] Linwood Dibbles, MD   MDM Rules/Calculators/A&P   ABCD2 Score: 5           NIH Stroke Scale: 0      Patient presents to the ED for evaluation of acute stroke-like symptoms.  Patient has history of low back pain causing some leg issues but the patient started  having numbness in his arms and face.  On examination the patient's neurologic exam is reassuring.  NIH stroke score is 0.  ABCD D squared score is 5.  Patient has increased risk for stroke patient also has elevated blood pressure.  He was given an oral dose of Norvasc.  Discussed further treatment with the patient.  Think the patient would benefit from expedited treatment.  Will consult with teleneurology see if they recommend inpatient evaluation. Teleneurology recommended admission.  Will consult with the medical service for admission and further work-up.  Final Clinical Impression(s) / ED Diagnoses Final diagnoses:  TIA (transient ischemic attack)    Rx / DC Orders ED Discharge Orders    None       Linwood Dibbles, MD 03/13/20 2228

## 2020-03-13 NOTE — ED Notes (Signed)
ED Provider at bedside. 

## 2020-03-13 NOTE — ED Notes (Signed)
ekg to edp

## 2020-03-14 ENCOUNTER — Encounter (HOSPITAL_COMMUNITY): Payer: Self-pay | Admitting: Internal Medicine

## 2020-03-14 ENCOUNTER — Observation Stay (HOSPITAL_COMMUNITY): Payer: Medicare Other

## 2020-03-14 ENCOUNTER — Observation Stay (HOSPITAL_BASED_OUTPATIENT_CLINIC_OR_DEPARTMENT_OTHER): Payer: Medicare Other

## 2020-03-14 DIAGNOSIS — G459 Transient cerebral ischemic attack, unspecified: Secondary | ICD-10-CM

## 2020-03-14 DIAGNOSIS — R29898 Other symptoms and signs involving the musculoskeletal system: Secondary | ICD-10-CM

## 2020-03-14 LAB — LIPID PANEL
Cholesterol: 125 mg/dL (ref 0–200)
HDL: 42 mg/dL (ref >40)
LDL Cholesterol: 64 mg/dL (ref 0–99)
Total CHOL/HDL Ratio: 3 RATIO
Triglycerides: 97 mg/dL (ref <150)
VLDL: 19 mg/dL (ref 0–40)

## 2020-03-14 LAB — ECHOCARDIOGRAM COMPLETE
Height: 67 in
Weight: 3379.21 oz

## 2020-03-14 LAB — HIV ANTIBODY (ROUTINE TESTING W REFLEX): HIV Screen 4th Generation wRfx: NONREACTIVE

## 2020-03-14 IMAGING — US US CAROTID DUPLEX BILAT
1 series · 13 of 24 positions shown · non-contrast
Comparison: None.

CLINICAL DATA: 68-year-old male with a history of prior CVA

EXAM:
BILATERAL CAROTID DUPLEX ULTRASOUND
TECHNIQUE: Gray scale imaging, color Doppler and duplex ultrasound were
performed of bilateral carotid and vertebral arteries in the neck.

[Series 1: us carotid duplex bilat · 0.06mm/px · 13 of 98 slices shown]
[im 1/98]
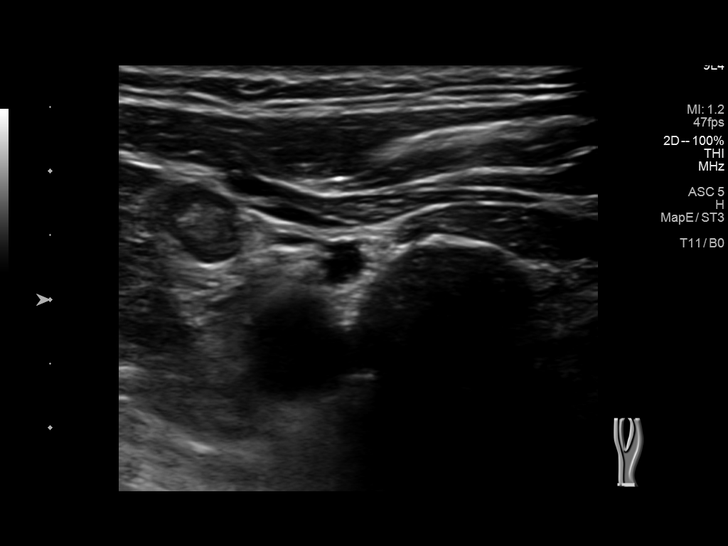
[im 9/98]
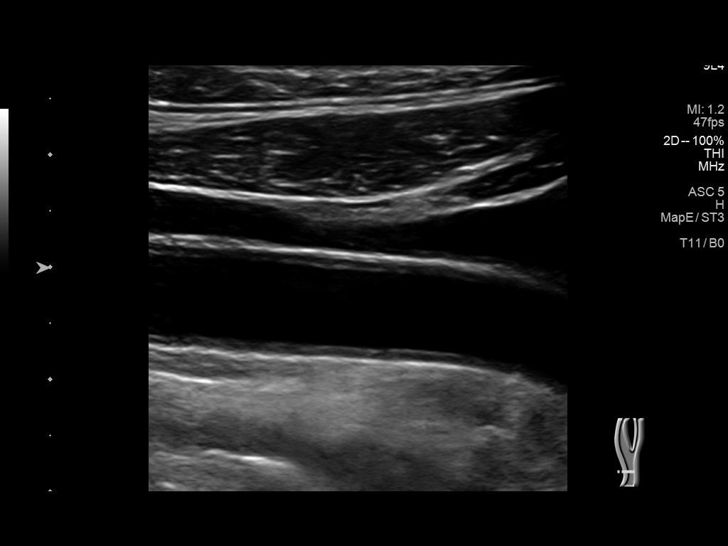
[im 17/98]
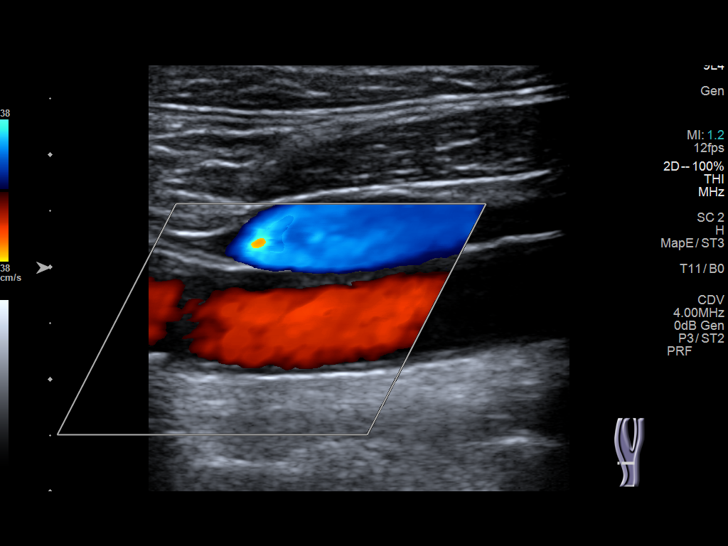
[im 26/98]
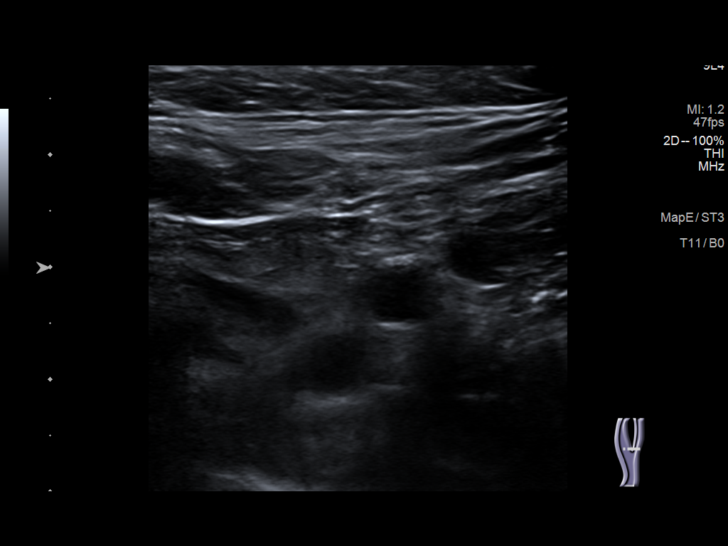
[im 34/98]
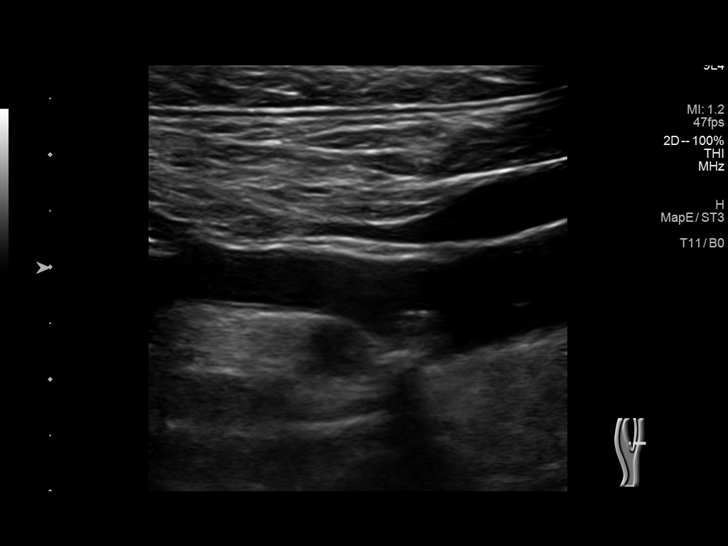
[im 43/98]
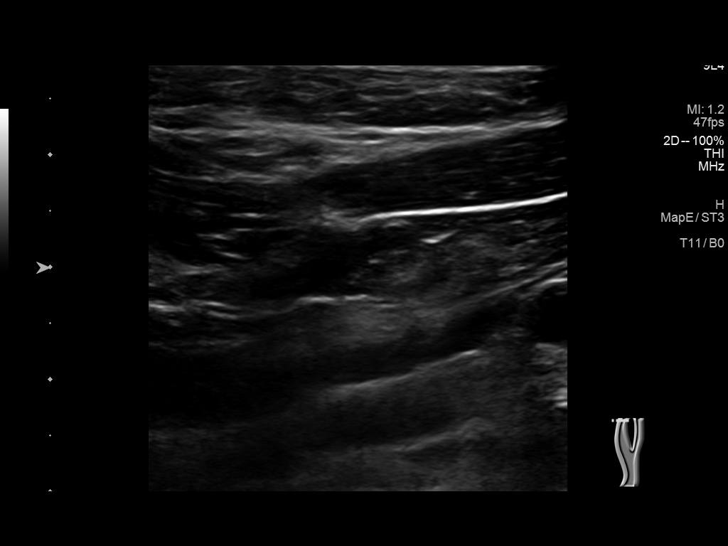
[im 51/98]
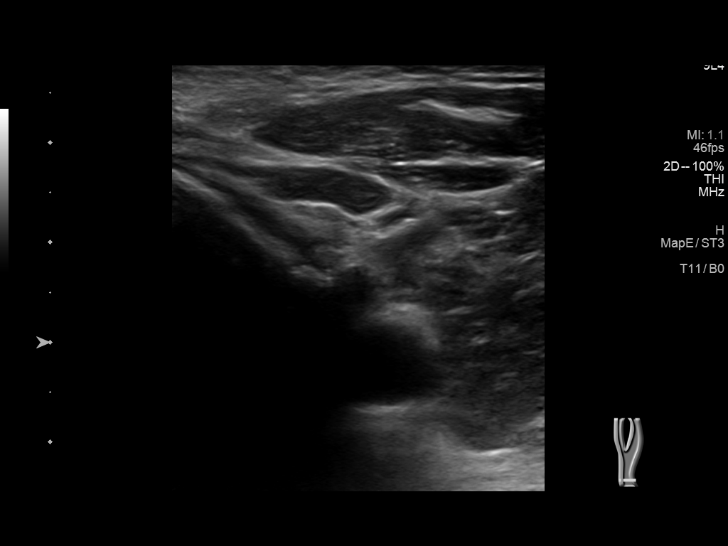
[im 55/98]
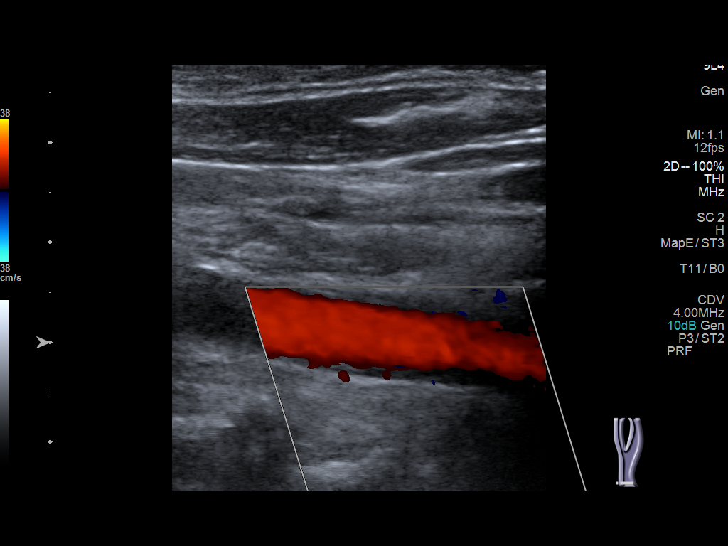
[im 64/98]
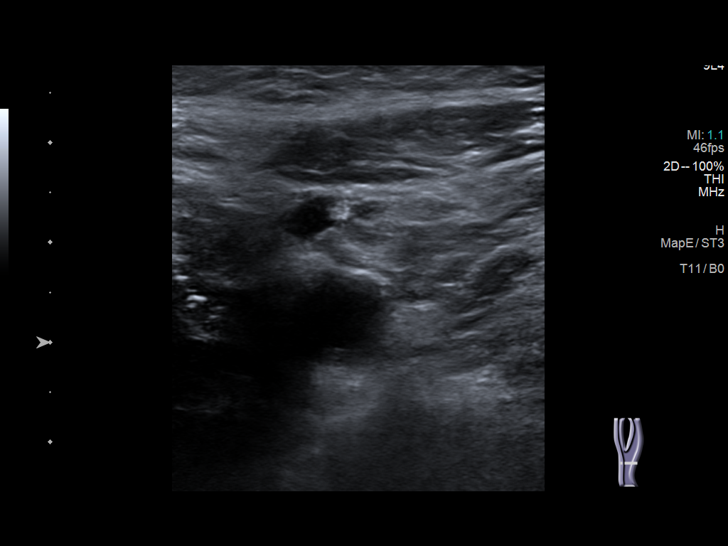
[im 72/98]
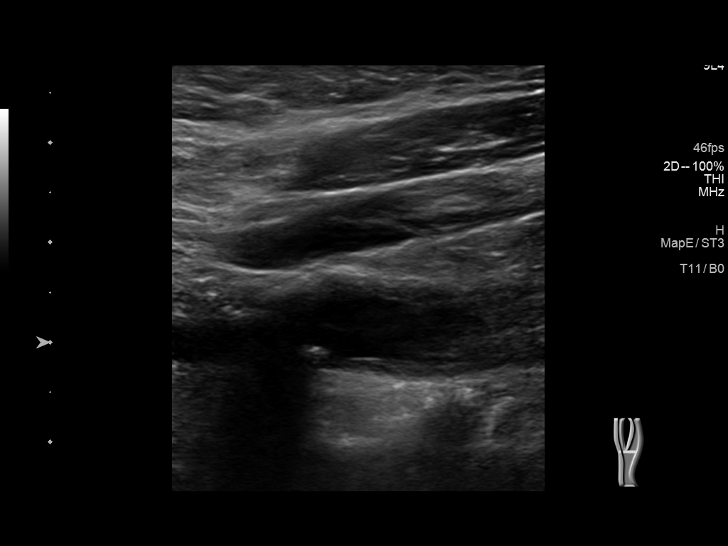
[im 81/98]
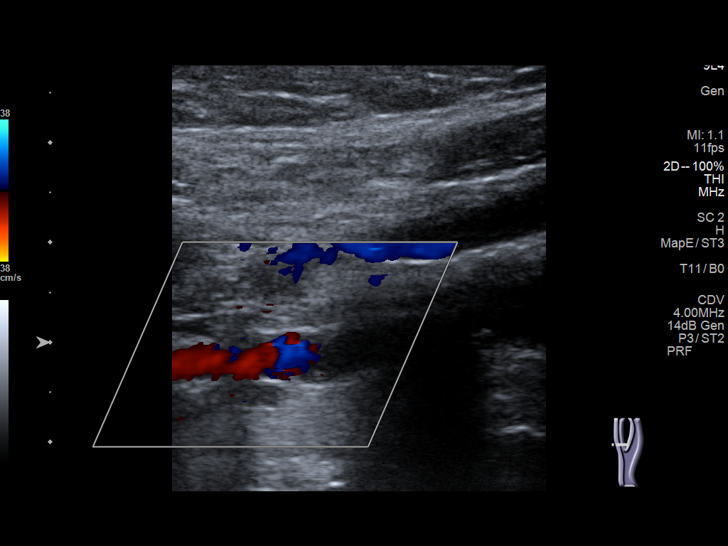
[im 89/98]
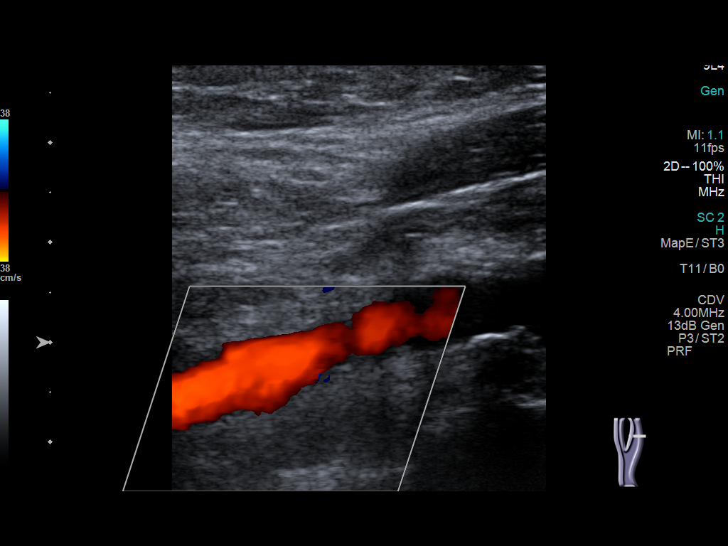
[im 98/98]
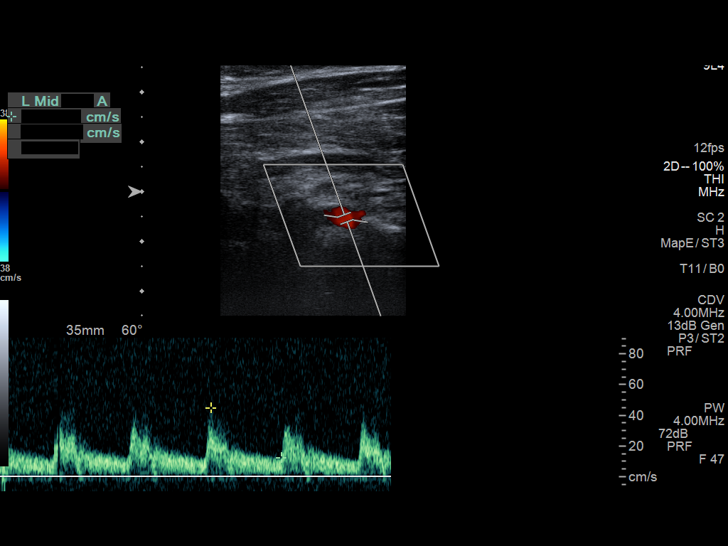

[13 of 24 positions shown; findings below may reference images not displayed]

FINDINGS: Criteria: Quantification of carotid stenosis is based on velocity
parameters that correlate the residual internal carotid diameter
with NASCET-based stenosis levels, using the diameter of the distal
internal carotid lumen as the denominator for stenosis measurement.

The following velocity measurements were obtained:

RIGHT

ICA:  Systolic 75 cm/sec, Diastolic 25 cm/sec

CCA:  70 cm/sec

SYSTOLIC ICA/CCA RATIO:

ECA:  102 cm/sec

LEFT

ICA:  Systolic 77 cm/sec, Diastolic 31 cm/sec

CCA:  51 cm/sec

SYSTOLIC ICA/CCA RATIO:

ECA:  37 cm/sec

Right Brachial SBP: Not acquired

Left Brachial SBP: Not acquired

RIGHT CAROTID ARTERY: No significant calcifications of the right
common carotid artery. Intermediate waveform maintained.
Heterogeneous and partially calcified plaque at the right carotid
bifurcation. No significant lumen shadowing. Low resistance waveform
of the right ICA. No significant tortuosity.

RIGHT VERTEBRAL ARTERY: Antegrade flow with low resistance waveform.

LEFT CAROTID ARTERY: No significant calcifications of the left
common carotid artery. Intermediate waveform maintained.
Heterogeneous and partially calcified plaque at the left carotid
bifurcation without significant lumen shadowing. Low resistance
waveform of the left ICA. No significant tortuosity.

LEFT VERTEBRAL ARTERY:  Antegrade flow with low resistance waveform.
IMPRESSION: Color duplex indicates minimal heterogeneous and calcified plaque,
with no hemodynamically significant stenosis by duplex criteria in
the extracranial cerebrovascular circulation.

## 2020-03-14 IMAGING — MR MR MRA HEAD W/O CM
1 series · 16 of 48 positions shown · non-contrast
Comparison: None.

CLINICAL DATA: Numbness of left upper extremity and face

EXAM:
MRA HEAD WITHOUT CONTRAST
TECHNIQUE: Angiographic images of the Circle of Willis were obtained using MRA
technique without intravenous contrast.

[Series 3: TOF fat-sat · axial · 0.8mm · 0.38mm/px · z∈[-104,-6]mm · 16 of 131 slices shown]
[im 1/131]
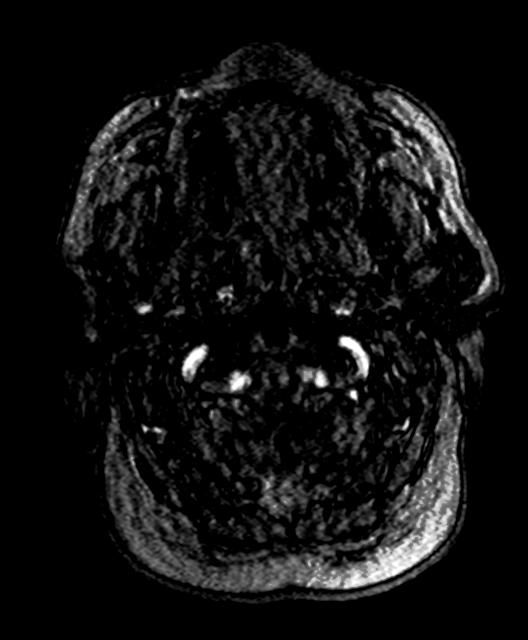
[im 3/131]
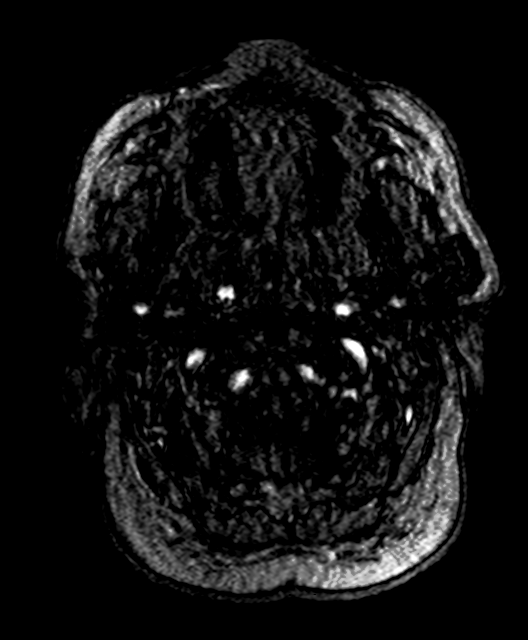
[im 6/131]
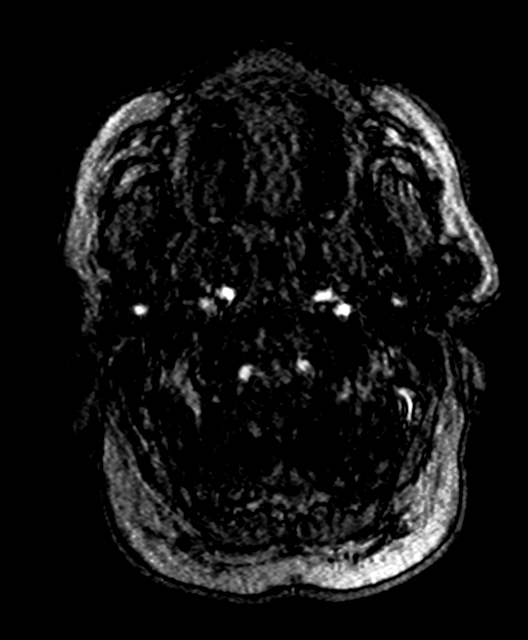
[im 9/131]
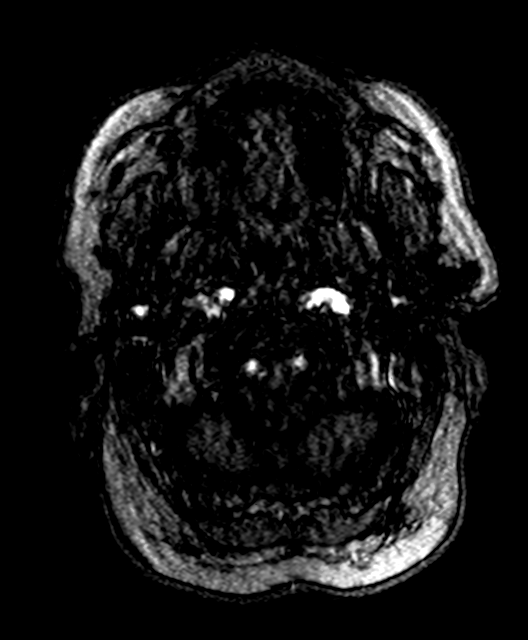
[im 12/131]
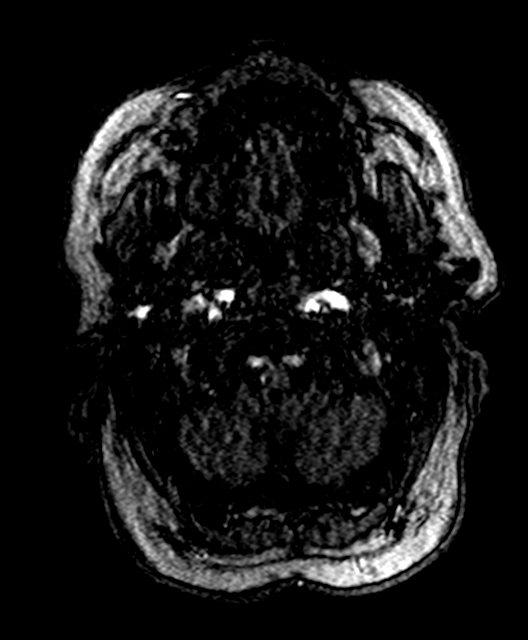
[im 14/131]
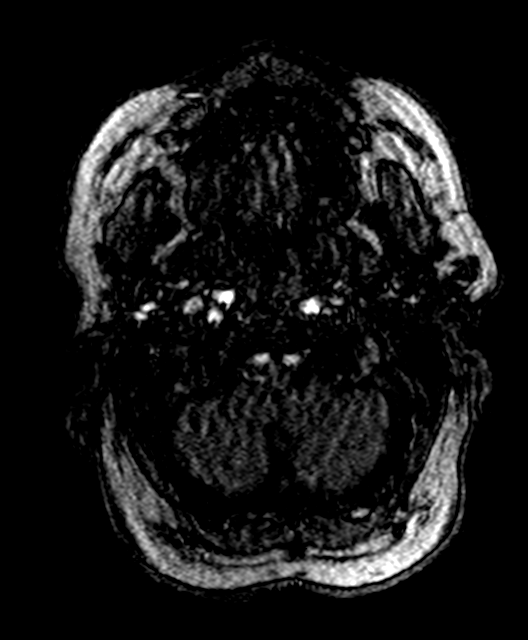
[im 23/131]
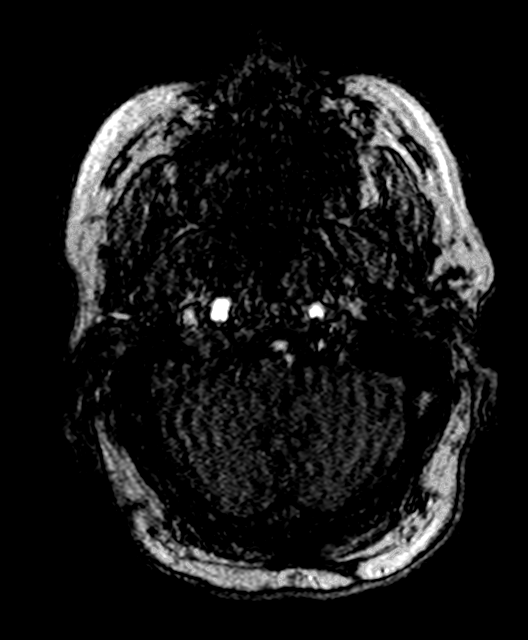
[im 25/131]
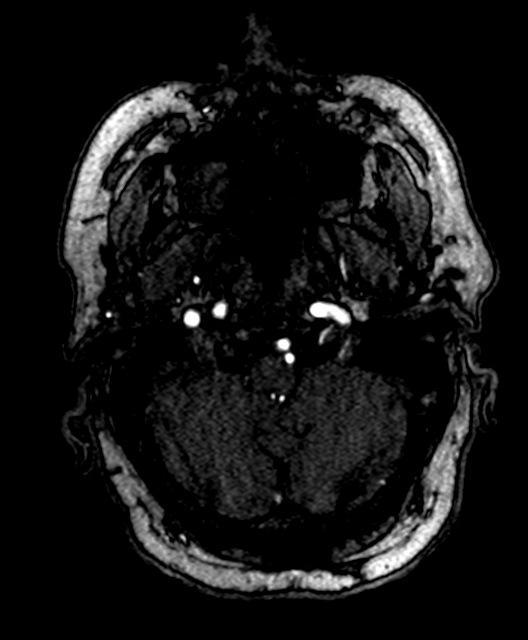
[im 42/131]
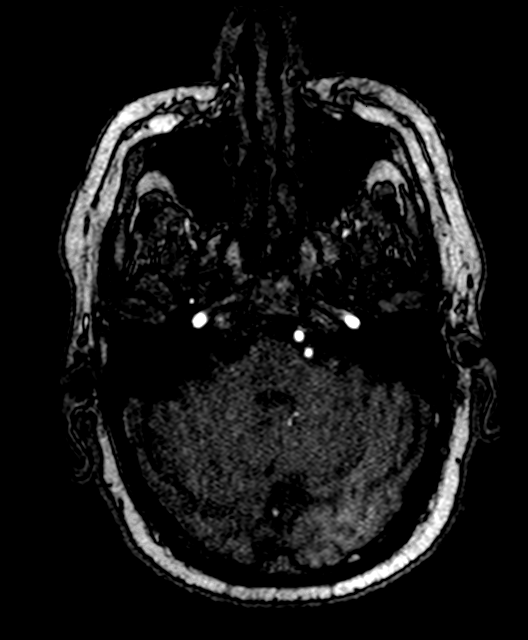
[im 59/131]
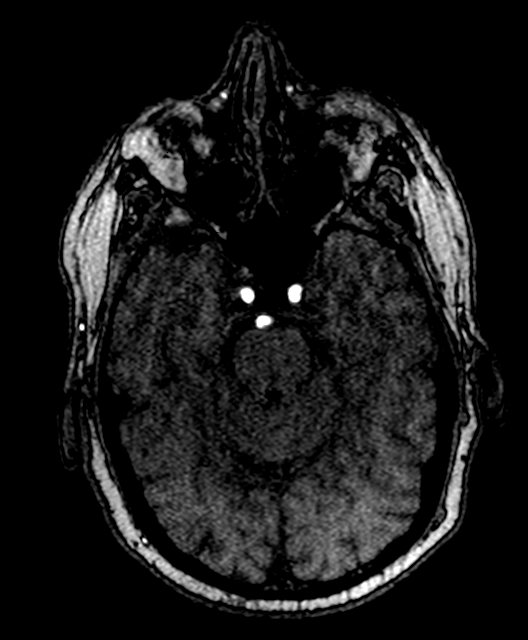
[im 67/131]
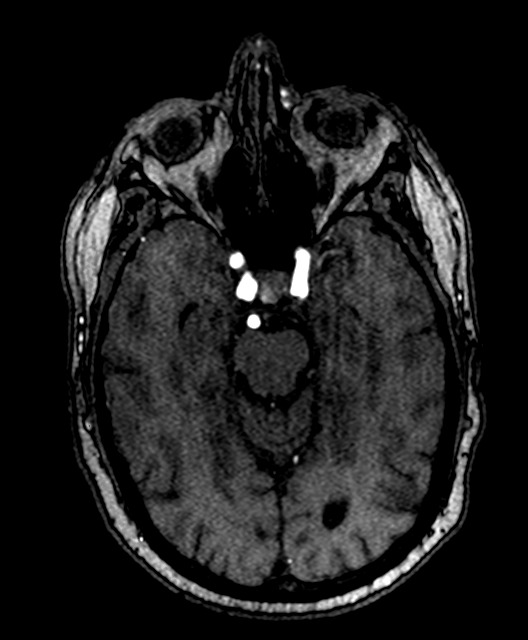
[im 75/131]
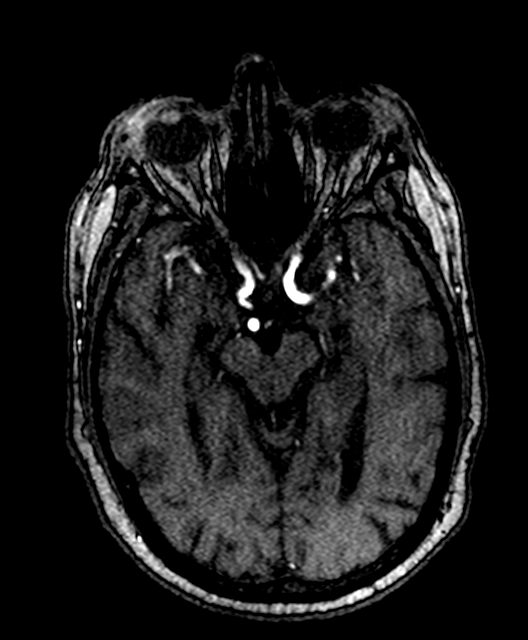
[im 92/131]
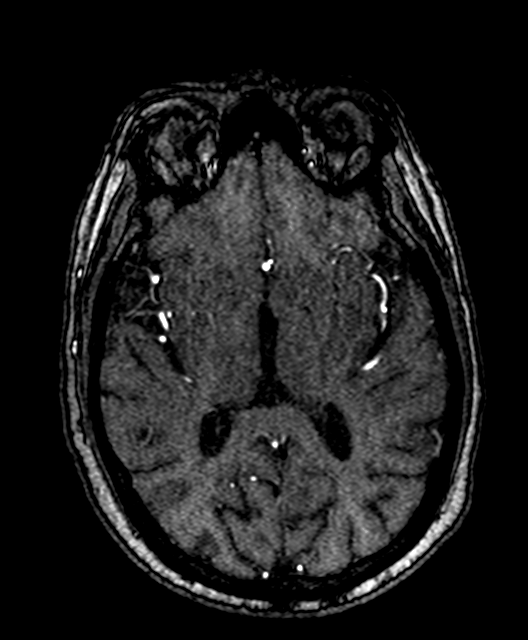
[im 108/131]
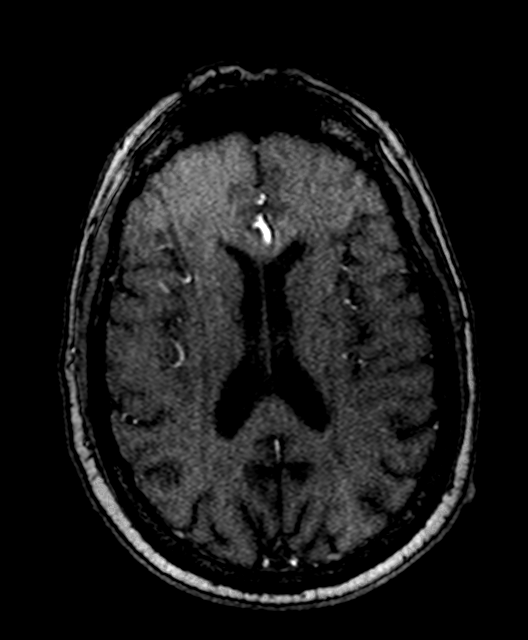
[im 111/131]
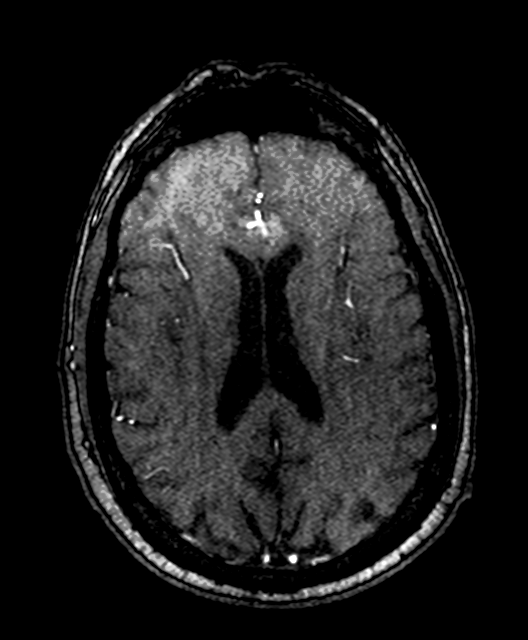
[im 125/131]
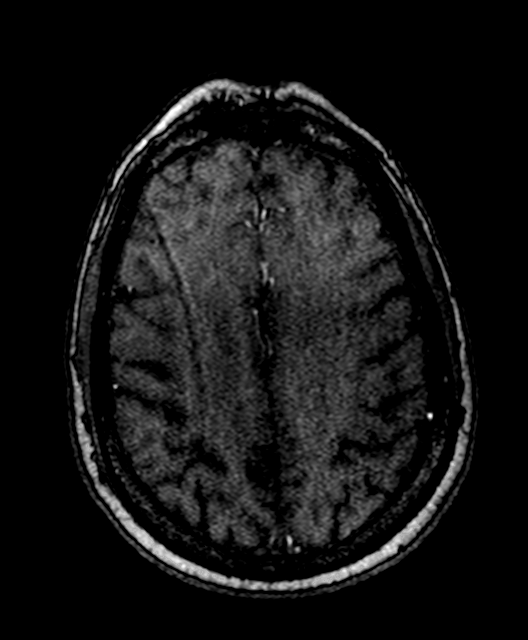

[16 of 48 positions shown; findings below may reference images not displayed]

FINDINGS: Intracranial internal carotid arteries are patent. Middle and
anterior cerebral arteries are patent with relatively diminished
proximal flow related enhancement on a technical basis. Left A1 ACA
is dominant.

Intracranial vertebral arteries and basilar artery are patent. The
posterior cerebral arteries are patent with relatively diminished
pleural enhancement of P2 segments on a technical basis. Right
posterior communicating artery is present. There is no significant
stenosis or aneurysm.
IMPRESSION: No proximal intracranial vessel occlusion or significant stenosis.

## 2020-03-14 MED ORDER — ATORVASTATIN CALCIUM 40 MG PO TABS
40.0000 mg | ORAL_TABLET | Freq: Every day | ORAL | Status: DC
  Administered 2020-03-14: 40 mg via ORAL
  Filled 2020-03-14: qty 1

## 2020-03-14 MED ORDER — ASPIRIN EC 325 MG PO TBEC: 325.0000 mg | DELAYED_RELEASE_TABLET | Freq: Every day | ORAL | Status: DC

## 2020-03-14 MED ORDER — CLOPIDOGREL BISULFATE 75 MG PO TABS
75.0000 mg | ORAL_TABLET | Freq: Every day | ORAL | 0 refills | Status: AC
Start: 2020-03-14 — End: 2020-04-13

## 2020-03-14 MED ORDER — ATORVASTATIN CALCIUM 20 MG PO TABS: 20.0000 mg | ORAL_TABLET | Freq: Every day | ORAL | Status: DC

## 2020-03-14 MED ORDER — ASPIRIN EC 81 MG PO TBEC
81.0000 mg | DELAYED_RELEASE_TABLET | Freq: Every day | ORAL | Status: DC
  Administered 2020-03-14: 81 mg via ORAL
  Filled 2020-03-14: qty 1

## 2020-03-14 MED ORDER — ALLOPURINOL 300 MG PO TABS
300.0000 mg | ORAL_TABLET | Freq: Every day | ORAL | Status: DC
  Administered 2020-03-14: 300 mg via ORAL
  Filled 2020-03-14: qty 1

## 2020-03-14 MED ORDER — ASPIRIN EC 81 MG PO TBEC: 81.0000 mg | DELAYED_RELEASE_TABLET | Freq: Every day | ORAL | Status: DC

## 2020-03-14 MED ORDER — LOSARTAN POTASSIUM 50 MG PO TABS
50.0000 mg | ORAL_TABLET | Freq: Every day | ORAL | Status: DC
  Administered 2020-03-14: 50 mg via ORAL
  Filled 2020-03-14: qty 1

## 2020-03-14 NOTE — Progress Notes (Signed)
Notified by telemetry that patients heart rate dropped down to 34, nonsustained. Vital signs obtained. HR 72 at this time. MD notified.

## 2020-03-14 NOTE — Progress Notes (Signed)
SLP Cancellation Note  Patient Details Name: Timothy Hester MRN: [id number] DOB: [birth year]   Cancelled treatment:       Reason Eval/Treat Not Completed: SLP screened, no needs identified, will sign off; SLP screened Pt in room. Pt denies any changes in swallowing, speech, language, or cognition. MRI negative for acute changes. SLE will be deferred at this time. Reconsult if indicated. SLP will sign off.  Thank you,  Havery Moros, CCC-SLP [phone]    Timothy Hester 03/14/2020, 9:18 AM

## 2020-03-14 NOTE — Progress Notes (Signed)
  Echocardiogram 2D Echocardiogram has been performed.  Timothy Hester 03/14/2020, 11:52 AM

## 2020-03-14 NOTE — Plan of Care (Signed)
  Problem: Coping: Goal: Level of anxiety will decrease Outcome: Progressing   Problem: Education: Goal: Knowledge of secondary prevention will improve Outcome: Progressing

## 2020-03-14 NOTE — H&P (Signed)
History and Physical    Timothy Hester QPY:[id number] DOB: 1951/06/24 DOA: 03/13/2020  PCP: Joaquim Nam, MD (Confirm with patient/family/NH records and if not entered, this has to be entered at [facility] point of entry) Patient coming from: Home  I have personally briefly reviewed patient's old medical records in [facility]  Chief Complaint: Weakness of left leg with numbness of left face and left arm.  HPI: Timothy Hester is a 69 y.o. male with medical history significant of hypertension, hyperlipidemia, gout and tobacco dependence presented to ED for evaluation of stroke like symptoms.  Patient states that this morning he woke up around 2 AM to use the restroom and found having severe difficulty in moving his left leg and reported dragging of his left leg and the symptoms completely resolved around the morning and he was able to walk without any difficulty.  Patient states that later today he started experiencing numbness in his left half of face and left arm that also continued for about 10 minutes and then resolved.  Patient mentioned that he called a family member who is nurse and was recommended to go to ED for further evaluation.  Patient states that his symptoms are completely resolved at this time and denies fever, chills, headache, blurriness of vision, dizziness, chest pain, shortness of breath, nausea, vomiting, abdominal pain and urinary symptoms.  Patient admits of smoking 1 pack of cigarettes daily.  ED Course: On arrival to the hospital, patient had blood pressure of 170/115, heart rate 73, respiratory rate 15, temperature 98.3 and oxygen saturation 95% on room air.  Lab work showed WBC 7.8, hemoglobin 13.8, BUN 14, creatinine 1.08, blood glucose 86.  UA negative for UTI, UDS negative, CT head and MRI brain negative for acute intracranial pathology but showed age-related cerebral atrophy with chronic small vessel ischemic disease.  ED physician consulted telemetry neurology who  recommended admission for MRI of head and neck and neurology consult.  Review of Systems: As per HPI otherwise 10 point review of systems negative.  Unacceptable ROS statements: "10 systems reviewed," "Extensive" (without elaboration).  Acceptable ROS statements: "All others negative," "All others reviewed and are negative," and "All others unremarkable," with at LEAST ONE ROS documented Can't double dip - if using for HPI can't use for ROS  Past Medical History:  Diagnosis Date  . Arthritis   . Gout   . Hyperglycemia   . Hyperlipidemia   . Hypertension   . Lower back pain   . Rheumatic fever    History of  . Smoker     Past Surgical History:  Procedure Laterality Date  . APPENDECTOMY    . TONSILLECTOMY       reports that he has been smoking cigarettes. He has a 50.00 pack-year smoking history. He has never used smokeless tobacco. He reports that he does not drink alcohol or use drugs.  Allergies  Allergen Reactions  . Erythromycin Rash    Family History  Problem Relation Age of Onset  . Colon cancer Neg Hx   . Prostate cancer Neg Hx    Family history reviewed but not pertinent Unacceptable: Noncontributory, unremarkable, or negative. Acceptable: (example)Family history negative for heart disease  Prior to Admission medications   Medication Sig Start Date End Date Taking? Authorizing Provider  allopurinol (ZYLOPRIM) 300 MG tablet Take 300 mg by mouth daily.   Yes [provider]  aspirin EC 81 MG tablet Take 81 mg by mouth daily.   Yes [provider]  atorvastatin (LIPITOR) 20 MG tablet Take 20 mg by mouth daily.   Yes [provider]  losartan (COZAAR) 50 MG tablet Take 50 mg by mouth daily. .   Yes [provider]  Menthol, Topical Analgesic, (BIOFREEZE EX) Apply 1 application topically daily as needed (for back pain).   Yes [provider]  metFORMIN (GLUCOPHAGE-XR) 500 MG 24 hr tablet Take 500 mg by mouth daily.   02/26/20  Yes [provider]    Physical Exam: Vitals:   03/14/20 0305 03/14/20 0342 03/14/20 0512 03/14/20 0535  BP: (!) 163/103 (!) 162/89 (!) 153/98 (!) 147/93  Pulse: 67 72 68 69  Resp: 16 20 18 20   Temp: 97.9 F (36.6 C)  98 F (36.7 C) 97.8 F (36.6 C)  TempSrc:    Oral  SpO2: 94% 98% 95% 95%  Weight:      Height:        Constitutional: NAD, calm, comfortable Vitals:   03/14/20 0305 03/14/20 0342 03/14/20 0512 03/14/20 0535  BP: (!) 163/103 (!) 162/89 (!) 153/98 (!) 147/93  Pulse: 67 72 68 69  Resp: 16 20 18 20   Temp: 97.9 F (36.6 C)  98 F (36.7 C) 97.8 F (36.6 C)  TempSrc:    Oral  SpO2: 94% 98% 95% 95%  Weight:      Height:        General: Patient is a 69 year old male in no acute distress. Eyes: PERRL, lids and conjunctivae normal ENMT: Mucous membranes are moist. Posterior pharynx clear of any exudate or lesions.Normal dentition.  Neck: normal, supple, no masses, no thyromegaly Respiratory: clear to auscultation bilaterally, no wheezing, no crackles. Normal respiratory effort. No accessory muscle use.  Cardiovascular: Regular rate and rhythm, no murmurs / rubs / gallops. No extremity edema. 2+ pedal pulses. No carotid bruits.  Abdomen: no tenderness, no masses palpated. No hepatosplenomegaly. Bowel sounds positive.  Musculoskeletal: no clubbing / cyanosis. No joint deformity upper and lower extremities. Good ROM, no contractures. Normal muscle tone.  Skin: no rashes, lesions, ulcers. No induration Neurologic: CN 2-12 grossly intact. Sensation intact, DTR normal. Strength 5/5 in all 4.  Psychiatric: Normal judgment and insight. Alert and oriented x 3. Normal mood.   (Anything < 9 systems with 2 bullets each down codes to level 1) (If patient refuses exam can't bill higher level) (Make sure to document decubitus ulcers present on admission -- if possible -- and whether patient has chronic indwelling catheter at time of admission)  Labs on  Admission: I have personally reviewed following labs and imaging studies  CBC: Recent Labs  Lab 03/13/20 1723  WBC 7.5  NEUTROABS 4.5  HGB 13.8  HCT 42.2  MCV 99.5  PLT 865   Basic Metabolic Panel: Recent Labs  Lab 03/13/20 1723  NA 134*  K 3.8  CL 94*  CO2 28  GLUCOSE 86  BUN 14  CREATININE 1.08  CALCIUM 8.9   GFR: Estimated Creatinine Clearance: 71.2 mL/min (by C-G formula based on SCr of 1.08 mg/dL). Liver Function Tests: Recent Labs  Lab 03/13/20 1723  AST 36  ALT 22  ALKPHOS 44  BILITOT 0.7  PROT 7.6  ALBUMIN 4.6   No results for input(s): LIPASE, AMYLASE in the last 168 hours. No results for input(s): AMMONIA in the last 168 hours. Coagulation Profile: Recent Labs  Lab 03/13/20 1723  INR 1.0   Cardiac Enzymes: No results for input(s): CKTOTAL, CKMB, CKMBINDEX, TROPONINI in the last 168  hours. BNP (last 3 results) No results for input(s): PROBNP in the last 8760 hours. HbA1C: No results for input(s): HGBA1C in the last 72 hours. CBG: No results for input(s): GLUCAP in the last 168 hours. Lipid Profile: No results for input(s): CHOL, HDL, LDLCALC, TRIG, CHOLHDL, LDLDIRECT in the last 72 hours. Thyroid Function Tests: No results for input(s): TSH, T4TOTAL, FREET4, T3FREE, THYROIDAB in the last 72 hours. Anemia Panel: No results for input(s): VITAMINB12, FOLATE, FERRITIN, TIBC, IRON, RETICCTPCT in the last 72 hours. Urine analysis:    Component Value Date/Time   COLORURINE YELLOW 03/13/2020 1820   APPEARANCEUR CLEAR 03/13/2020 1820   APPEARANCEUR Hazy 05/25/2012 0908   LABSPEC 1.011 03/13/2020 1820   LABSPEC 1.026 05/25/2012 0908   PHURINE 7.0 03/13/2020 1820   GLUCOSEU NEGATIVE 03/13/2020 1820   GLUCOSEU Negative 05/25/2012 0908   HGBUR NEGATIVE 03/13/2020 1820   BILIRUBINUR NEGATIVE 03/13/2020 1820   BILIRUBINUR Negative 05/25/2012 0908   KETONESUR NEGATIVE 03/13/2020 1820   PROTEINUR NEGATIVE 03/13/2020 1820   NITRITE NEGATIVE  03/13/2020 1820   LEUKOCYTESUR NEGATIVE 03/13/2020 1820   LEUKOCYTESUR Negative 05/25/2012 0908    Radiological Exams on Admission: CT HEAD WO CONTRAST  Result Date: 03/13/2020 CLINICAL DATA:  Left leg weakness this morning at 2 a.m., left upper extremity and left-sided facial numbness at 1 p.m., last known well 11 p.m. yesterday EXAM: CT HEAD WITHOUT CONTRAST TECHNIQUE: Contiguous axial images were obtained from the base of the skull through the vertex without intravenous contrast. COMPARISON:  None. FINDINGS: Brain: No acute infarct or hemorrhage. Lateral ventricles and midline structures are unremarkable. No acute extra-axial fluid collections. No mass effect. Vascular: No hyperdense vessel or unexpected calcification. Skull: Normal. Negative for fracture or focal lesion. Sinuses/Orbits: No acute finding. Other: None. IMPRESSION: 1. No acute intracranial process. Electronically Signed   By: Sharlet Salina M.D.   On: 03/13/2020 18:00   MR BRAIN WO CONTRAST  Result Date: 03/13/2020 CLINICAL DATA:  Initial evaluation for numbness of left face and left upper extremity. EXAM: MRI HEAD WITHOUT CONTRAST TECHNIQUE: Multiplanar, multiecho pulse sequences of the brain and surrounding structures were obtained without intravenous contrast. COMPARISON:  Prior CT from earlier same day. FINDINGS: Brain: Mild age-related cerebral atrophy. Patchy T2/FLAIR hyperintensity within the periventricular and deep white matter both cerebral hemispheres most consistent with chronic small vessel ischemic disease, mild in nature. No abnormal foci of restricted diffusion to suggest acute or subacute ischemia. Gray-white matter differentiation maintained. No encephalomalacia to suggest chronic cortical infarction. No foci of susceptibility artifact to suggest acute or chronic intracranial hemorrhage. No mass lesion, midline shift or mass effect. No hydrocephalus or extra-axial fluid collection. Pituitary gland suprasellar region  normal. Midline structures intact. Vascular: Major intracranial vascular flow voids are maintained. Skull and upper cervical spine: Craniocervical junction within normal limits. Upper cervical spine normal. Bone marrow signal intensity within normal limits. No scalp soft tissue abnormality. Sinuses/Orbits: Globes and orbital soft tissues within normal limits. Mucosal thickening and/or retention cyst present within the maxillary sinuses. Paranasal sinuses are otherwise clear. Trace right mastoid effusion noted, of doubtful significance. Inner ear structures grossly normal. Other: None. IMPRESSION: 1. No acute intracranial abnormality. 2. Mild age-related cerebral atrophy with chronic small vessel ischemic disease. Electronically Signed   By: Rise Mu M.D.   On: 03/13/2020 20:35      Assessment/Plan Principal Problem:   Left leg weakness Patient presented for evaluation of left leg weakness and numbness of left side of his face and left  upper extremity but the symptoms are completely resolved.  Patient most probably had TIA. CT head and MRI negative for acute intracranial bleed or pathology. Telemetry neurologist consulted who recommended admitting the patient for observation and performing MRA head and MRA neck along with neurology consult. Aspirin ordered Lipitor 20 mg daily ordered. Lipid panel ordered Neurology consult ordered. Permissive hypertension. MRA head and neck ordered as per neurology recommendations. Neurochecks and fall precautions ordered PT/OT consult ordered  Active Problems:   Smoker Patient admits of smoking 1 pack of cigarettes daily. Tobacco cessation counseling done    Hypertension Permissive hypertension. Home medications ordered and will be given if the blood pressure goes above 180 .   Hyperlipidemia Continue Lipitor 20 mg daily    Gout Stable Continue home allopurinol  (please populate well all problems here in Problem List. (For example, if  patient is on BP meds at home and you resume or decide to hold them, it is a problem that needs to be her. Same for CAD, COPD, HLD and so on)     DVT prophylaxis: Lovenox Code Status: Full code Family Communication: No family member present at the bedside.   Disposition Plan:  Consults called: Neurology consult ordered Admission status: Observation/telemetry   Thalia Party MD [facility] Pager [phone]   If 7PM-7AM, please contact night-coverage [email or web address] Password   03/14/2020, 5:38 AM

## 2020-03-14 NOTE — Evaluation (Signed)
Physical Therapy Evaluation Patient Details Name: Timothy Hester MRN: [id number] DOB: 07/21/1951 Today's Date: 03/14/2020   History of Present Illness  Timothy Hester is a 69 y.o. male with medical history significant of hypertension, hyperlipidemia, gout and tobacco dependence presented to ED for evaluation of stroke like symptoms.  Patient states that this morning he woke up around 2 AM to use the restroom and found having severe difficulty in moving his left leg and reported dragging of his left leg and the symptoms completely resolved around the morning and he was able to walk without any difficulty.  Patient states that later today he started experiencing numbness in his left half of face and left arm that also continued for about 10 minutes and then resolved.  Patient mentioned that he called a family member who is nurse and was recommended to go to ED for further evaluation.  Patient states that his symptoms are completely resolved at this time and denies fever, chills, headache, blurriness of vision, dizziness, chest pain, shortness of breath, nausea, vomiting, abdominal pain and urinary symptoms.  Patient admits of smoking 1 pack of cigarettes daily.    Clinical Impression  Patient functioning at baseline for functional mobility and gait.  Plan:  Patient discharged from physical therapy to care of nursing for ambulation daily as tolerated for length of stay.    Follow Up Recommendations No PT follow up    Equipment Recommendations  None recommended by PT    Recommendations for Other Services       Precautions / Restrictions Precautions Precautions: None Restrictions Weight Bearing Restrictions: No      Mobility  Bed Mobility Overal bed mobility: Independent                Transfers Overall transfer level: Modified independent Equipment used: Straight cane             General transfer comment: slightly labored movement  Ambulation/Gait Ambulation/Gait  assistance: Modified independent (Device/Increase time) Gait Distance (Feet): 100 Feet Assistive device: Straight cane Gait Pattern/deviations: WFL(Within Functional Limits);Wide base of support Gait velocity: decreased   General Gait Details: grossly WFL, slightly labored cadence with wider base of support which is baseline per patient due to chronic low back pain  Stairs Stairs: Yes Stairs assistance: Modified independent (Device/Increase time) Stair Management: Two rails;Step to pattern Number of Stairs: 4 General stair comments: demonstrates good return for going up/down steps using bilateral siderails in stairwell without loss of balance  Wheelchair Mobility    Modified Rankin (Stroke Patients Only)       Balance Overall balance assessment: Needs assistance Sitting-balance support: Feet supported;No upper extremity supported Sitting balance-Leahy Scale: Good Sitting balance - Comments: seated at EOB   Standing balance support: During functional activity;Single extremity supported Standing balance-Leahy Scale: Fair Standing balance comment: fair/good using SPC                             Pertinent Vitals/Pain Pain Assessment: No/denies pain    Home Living Family/patient expects to be discharged to:: Private residence Living Arrangements: Spouse/significant other Available Help at Discharge: Family;Available PRN/intermittently Type of Home: House Home Access: Stairs to enter Entrance Stairs-Rails: Right;Left;Can reach both Entrance Stairs-Number of Steps: 2 Home Layout: Two level;Able to live on main level with bedroom/bathroom Home Equipment: Kasandra Knudsen - single point      Prior Function Level of Independence: Independent with assistive device(s)  Comments: Community ambulator wiht SPC, drives     Hand Dominance   Dominant Hand: Right    Extremity/Trunk Assessment   Upper Extremity Assessment Upper Extremity Assessment: Defer to OT  evaluation    Lower Extremity Assessment Lower Extremity Assessment: Overall WFL for tasks assessed    Cervical / Trunk Assessment Cervical / Trunk Assessment: Normal  Communication   Communication: No difficulties  Cognition Arousal/Alertness: Awake/alert Behavior During Therapy: WFL for tasks assessed/performed Overall Cognitive Status: Within Functional Limits for tasks assessed                                        General Comments      Exercises     Assessment/Plan    PT Assessment Patent does not need any further PT services  PT Problem List         PT Treatment Interventions      PT Goals (Current goals can be found in the Care Plan section)  Acute Rehab PT Goals Patient Stated Goal: return home with family to assist PT Goal Formulation: With patient Time For Goal Achievement: 03/14/20 Potential to Achieve Goals: Good    Frequency     Barriers to discharge        Co-evaluation               AM-PAC PT "6 Clicks" Mobility  Outcome Measure Help needed turning from your back to your side while in a flat bed without using bedrails?: None Help needed moving from lying on your back to sitting on the side of a flat bed without using bedrails?: None Help needed moving to and from a bed to a chair (including a wheelchair)?: None Help needed standing up from a chair using your arms (e.g., wheelchair or bedside chair)?: None Help needed to walk in hospital room?: None Help needed climbing 3-5 steps with a railing? : None 6 Click Score: 24    End of Session   Activity Tolerance: Patient tolerated treatment well Patient left: in bed;with call bell/phone within reach Nurse Communication: Mobility status PT Visit Diagnosis: Unsteadiness on feet (R26.81);Other abnormalities of gait and mobility (R26.89);Muscle weakness (generalized) (M62.81)    Time: 1610-9604 PT Time Calculation (min) (ACUTE ONLY): 20 min   Charges:   PT  Evaluation $PT Eval Moderate Complexity: 1 Mod PT Treatments $Therapeutic Activity: 8-22 mins        10:09 AM, 03/14/20 Ocie Bob, MPT Physical Therapist with [facility] [phone] office [phone] mobile phone

## 2020-03-14 NOTE — Plan of Care (Signed)
  Problem: Education: Goal: Knowledge of General Education information will improve Description: Including pain rating scale, medication(s)/side effects and non-pharmacologic comfort measures Outcome: Adequate for Discharge   Problem: Health Behavior/Discharge Planning: Goal: Ability to manage health-related needs will improve Outcome: Adequate for Discharge   Problem: Clinical Measurements: Goal: Ability to maintain clinical measurements within normal limits will improve Outcome: Adequate for Discharge Goal: Will remain free from infection Outcome: Adequate for Discharge Goal: Diagnostic test results will improve Outcome: Adequate for Discharge Goal: Respiratory complications will improve Outcome: Adequate for Discharge Goal: Cardiovascular complication will be avoided Outcome: Adequate for Discharge   Problem: Activity: Goal: Risk for activity intolerance will decrease Outcome: Adequate for Discharge   Problem: Nutrition: Goal: Adequate nutrition will be maintained Outcome: Adequate for Discharge   Problem: Coping: Goal: Level of anxiety will decrease 03/14/2020 1646 by Karolee Ohs, RN Outcome: Adequate for Discharge 03/14/2020 1644 by Karolee Ohs, RN Outcome: Progressing   Problem: Elimination: Goal: Will not experience complications related to bowel motility Outcome: Adequate for Discharge Goal: Will not experience complications related to urinary retention Outcome: Adequate for Discharge   Problem: Pain Managment: Goal: General experience of comfort will improve Outcome: Adequate for Discharge   Problem: Safety: Goal: Ability to remain free from injury will improve Outcome: Adequate for Discharge   Problem: Skin Integrity: Goal: Risk for impaired skin integrity will decrease Outcome: Adequate for Discharge   Problem: Education: Goal: Knowledge of secondary prevention will improve 03/14/2020 1646 by Karolee Ohs, RN Outcome: Adequate for  Discharge 03/14/2020 1644 by Karolee Ohs, RN Outcome: Progressing

## 2020-03-14 NOTE — Discharge Summary (Signed)
Physician Discharge Summary  Timothy Hester WGN:[id number] DOB: Oct 23, 1951 DOA: 03/13/2020  PCP: Joaquim Nam, MD  Admit date: 03/13/2020  Discharge date: 03/14/2020  Admitted From:Home  Disposition:  Home  Recommendations for Outpatient Follow-up:  1. Follow up with PCP in 1-2 weeks 2. Follow-up with Dr. Kittie Plater in 4-6 weeks 3. Continue on daily aspirin and Plavix as prescribed for total 4 weeks and then aspirin thereafter 4. Remain on statin as prior, LDL noted to be 64 5. Resume other home medications as prior 6. Counseled on tobacco cessation  Home Health: None  Equipment/Devices: None  Discharge Condition: Stable  CODE STATUS: Full  Diet recommendation: Heart Healthy  Brief/Interim Summary: Per HPI: Timothy Hester is a 69 y.o. male with medical history significant of hypertension, hyperlipidemia, gout and tobacco dependence presented to ED for evaluation of stroke like symptoms.  Patient states that this morning he woke up around 2 AM to use the restroom and found having severe difficulty in moving his left leg and reported dragging of his left leg and the symptoms completely resolved around the morning and he was able to walk without any difficulty.  Patient states that later today he started experiencing numbness in his left half of face and left arm that also continued for about 10 minutes and then resolved.  Patient mentioned that he called a family member who is nurse and was recommended to go to ED for further evaluation.  Patient states that his symptoms are completely resolved at this time and denies fever, chills, headache, blurriness of vision, dizziness, chest pain, shortness of breath, nausea, vomiting, abdominal pain and urinary symptoms.  Patient admits of smoking 1 pack of cigarettes daily.  -Patient presented with transient left-sided weakness with no findings noted on MRI.  He denies any further symptomatology and states that his numbness and  weakness have now resolved.  PT and OT evaluation as well as SLP evaluation with no home health needs noted.  He has been doing quite well and is stable for discharge today after discussion with Dr. Gerilyn Pilgrim neurology who recommends aspirin and Plavix for the next 4 weeks as well as continuation of statin.  Carotid ultrasound performed with no hemodynamically significant stenosis noted.  2D echocardiogram with LVEF 60-65% with no inter atrial shunt noted.  He has been counseled on smoking cessation and will follow up with Dr. Gerilyn Pilgrim in 4-6 weeks as recommended.  Discharge Diagnoses:  Principal Problem:   Left leg weakness Active Problems:   Smoker   Hypertension   Hyperlipidemia   Gout  Principal discharge diagnosis: Left-sided weakness secondary to TIA.  Discharge Instructions  Discharge Instructions    Diet - low sodium heart healthy   Complete by: As directed    Increase activity slowly   Complete by: As directed      Allergies as of 03/14/2020      Reactions   Erythromycin Rash      Medication List    TAKE these medications   allopurinol 300 MG tablet Commonly known as: ZYLOPRIM Take 300 mg by mouth daily.   aspirin EC 81 MG tablet Take 81 mg by mouth daily.   atorvastatin 20 MG tablet Commonly known as: LIPITOR Take 20 mg by mouth daily.   BIOFREEZE EX Apply 1 application topically daily as needed (for back pain).   clopidogrel 75 MG tablet Commonly known as: Plavix Take 1 tablet (75 mg total) by mouth daily.   losartan 50 MG tablet Commonly known  as: COZAAR Take 50 mg by mouth daily. .   metFORMIN 500 MG 24 hr tablet Commonly known as: GLUCOPHAGE-XR Take 500 mg by mouth daily.      Follow-up Information    Joaquim Nam, MD Follow up in 1 week(s).   Specialty: Family Medicine Contact information: [address] [place] [address] [phone]        Beryle Beams, MD Follow up in 4 week(s).   Specialty: Neurology Contact  information: [address] [place] [address] [phone]          Allergies  Allergen Reactions  . Erythromycin Rash    Consultations:  Discussed with Dr. Gerilyn Pilgrim on phone   Procedures/Studies: CT HEAD WO CONTRAST  Result Date: 03/13/2020 CLINICAL DATA:  Left leg weakness this morning at 2 a.m., left upper extremity and left-sided facial numbness at 1 p.m., last known well 11 p.m. yesterday EXAM: CT HEAD WITHOUT CONTRAST TECHNIQUE: Contiguous axial images were obtained from the base of the skull through the vertex without intravenous contrast. COMPARISON:  None. FINDINGS: Brain: No acute infarct or hemorrhage. Lateral ventricles and midline structures are unremarkable. No acute extra-axial fluid collections. No mass effect. Vascular: No hyperdense vessel or unexpected calcification. Skull: Normal. Negative for fracture or focal lesion. Sinuses/Orbits: No acute finding. Other: None. IMPRESSION: 1. No acute intracranial process. Electronically Signed   By: Sharlet Salina M.D.   On: 03/13/2020 18:00   MR ANGIO HEAD WO CONTRAST  Result Date: 03/14/2020 CLINICAL DATA:  Numbness of left upper extremity and face EXAM: MRA HEAD WITHOUT CONTRAST TECHNIQUE: Angiographic images of the Circle of Willis were obtained using MRA technique without intravenous contrast. COMPARISON:  None. FINDINGS: Intracranial internal carotid arteries are patent. Middle and anterior cerebral arteries are patent with relatively diminished proximal flow related enhancement on a technical basis. Left A1 ACA is dominant. Intracranial vertebral arteries and basilar artery are patent. The posterior cerebral arteries are patent with relatively diminished pleural enhancement of P2 segments on a technical basis. Right posterior communicating artery is present. There is no significant stenosis or aneurysm. IMPRESSION: No proximal intracranial vessel occlusion or significant stenosis. Electronically Signed   By: Guadlupe Spanish M.D.   On: 03/14/2020 10:16   MR BRAIN WO CONTRAST  Result Date: 03/13/2020 CLINICAL DATA:  Initial evaluation for numbness of left face and left upper extremity. EXAM: MRI HEAD WITHOUT CONTRAST TECHNIQUE: Multiplanar, multiecho pulse sequences of the brain and surrounding structures were obtained without intravenous contrast. COMPARISON:  Prior CT from earlier same day. FINDINGS: Brain: Mild age-related cerebral atrophy. Patchy T2/FLAIR hyperintensity within the periventricular and deep white matter both cerebral hemispheres most consistent with chronic small vessel ischemic disease, mild in nature. No abnormal foci of restricted diffusion to suggest acute or subacute ischemia. Gray-white matter differentiation maintained. No encephalomalacia to suggest chronic cortical infarction. No foci of susceptibility artifact to suggest acute or chronic intracranial hemorrhage. No mass lesion, midline shift or mass effect. No hydrocephalus or extra-axial fluid collection. Pituitary gland suprasellar region normal. Midline structures intact. Vascular: Major intracranial vascular flow voids are maintained. Skull and upper cervical spine: Craniocervical junction within normal limits. Upper cervical spine normal. Bone marrow signal intensity within normal limits. No scalp soft tissue abnormality. Sinuses/Orbits: Globes and orbital soft tissues within normal limits. Mucosal thickening and/or retention cyst present within the maxillary sinuses. Paranasal sinuses are otherwise clear. Trace right mastoid effusion noted, of doubtful significance. Inner ear structures grossly normal. Other: None. IMPRESSION: 1.  No acute intracranial abnormality. 2. Mild age-related cerebral atrophy with chronic small vessel ischemic disease. Electronically Signed   By: Rise MuBenjamin  McClintock M.D.   On: 03/13/2020 20:35   US Carotid Bilateral  Result Date: 03/14/2020 CLINICAL DATA:  69 year old male with a history of prior CVA EXAM:  BILATERAL CAROTID DUPLEX ULTRASOUND TECHNIQUE: Wallace CullensGray scale imaging, color Doppler and duplex ultrasound were performed of bilateral carotid and vertebral arteries in the neck. COMPARISON:  None. FINDINGS: Criteria: Quantification of carotid stenosis is based on velocity parameters that correlate the residual internal carotid diameter with NASCET-based stenosis levels, using the diameter of the distal internal carotid lumen as the denominator for stenosis measurement. The following velocity measurements were obtained: RIGHT ICA:  Systolic 75 cm/sec, Diastolic 25 cm/sec CCA:  70 cm/sec SYSTOLIC ICA/CCA RATIO:  1.0 ECA:  102 cm/sec LEFT ICA:  Systolic 77 cm/sec, Diastolic 31 cm/sec CCA:  51 cm/sec SYSTOLIC ICA/CCA RATIO:  1.5 ECA:  37 cm/sec Right Brachial SBP: Not acquired Left Brachial SBP: Not acquired RIGHT CAROTID ARTERY: No significant calcifications of the right common carotid artery. Intermediate waveform maintained. Heterogeneous and partially calcified plaque at the right carotid bifurcation. No significant lumen shadowing. Low resistance waveform of the right ICA. No significant tortuosity. RIGHT VERTEBRAL ARTERY: Antegrade flow with low resistance waveform. LEFT CAROTID ARTERY: No significant calcifications of the left common carotid artery. Intermediate waveform maintained. Heterogeneous and partially calcified plaque at the left carotid bifurcation without significant lumen shadowing. Low resistance waveform of the left ICA. No significant tortuosity. LEFT VERTEBRAL ARTERY:  Antegrade flow with low resistance waveform. IMPRESSION: Color duplex indicates minimal heterogeneous and calcified plaque, with no hemodynamically significant stenosis by duplex criteria in the extracranial cerebrovascular circulation. Signed, Yvone NeuJaime S. Reyne DumasWagner, DO, RPVI [facility] Electronically Signed   By: Gilmer MorJaime  Wagner D.O.   On: 03/14/2020 13:33   ECHOCARDIOGRAM  COMPLETE  Result Date: 03/14/2020    ECHOCARDIOGRAM REPORT   Patient Name:   Eliezer ChampagneIVEY A Kable Date of Exam: 03/14/2020 Medical Rec #:  [id number]       Height:       67.0 in Accession #:    [id number]      Weight:       211.2 lb Date of Birth:  04/16/1951      BSA:          2.070 m Patient Age:    68 years        BP:           156/102 mmHg Patient Gender: M               HR:           76 bpm. [facility] Procedure: 2D Echo, Color Doppler and Cardiac Doppler Indications:    TIA  History:        Patient has no prior history of Echocardiogram examinations.                 Risk Factors:Hypertension, Dyslipidemia and Current Smoker.  Sonographer:    Sheralyn Boatmanina West RDCS (AE) Referring Phys: [id number] [facility]  Sonographer Comments: Technically difficult study due to poor echo windows. IMPRESSIONS  1. Left ventricular ejection fraction, by estimation, is 60 to 65%. The left ventricle has normal function. The left ventricle has no regional wall motion abnormalities. There is severe left ventricular hypertrophy. Left ventricular diastolic parameters  are indeterminate.  2. Right ventricular systolic function is normal. The  right ventricular size is normal.  3. The mitral valve is normal in structure. Trivial mitral valve regurgitation. No evidence of mitral stenosis.  4. The aortic valve is tricuspid. Aortic valve regurgitation is not visualized. No aortic stenosis is present.  5. The inferior vena cava is normal in size with greater than 50% respiratory variability, suggesting right atrial pressure of 3 mmHg. FINDINGS  Left Ventricle: Left ventricular ejection fraction, by estimation, is 60 to 65%. The left ventricle has normal function. The left ventricle has no regional wall motion abnormalities. The left ventricular internal cavity size was normal in size. There is  severe left ventricular hypertrophy. Left ventricular diastolic parameters are indeterminate. Right Ventricle: The right ventricular size is  normal. No increase in right ventricular wall thickness. Right ventricular systolic function is normal. Left Atrium: Left atrial size was normal in size. Right Atrium: Right atrial size was normal in size. Pericardium: There is no evidence of pericardial effusion. Mitral Valve: The mitral valve is normal in structure. Trivial mitral valve regurgitation. No evidence of mitral valve stenosis. Tricuspid Valve: The tricuspid valve is normal in structure. Tricuspid valve regurgitation is not demonstrated. No evidence of tricuspid stenosis. Aortic Valve: The aortic valve is tricuspid. Aortic valve regurgitation is not visualized. No aortic stenosis is present. Aortic valve mean gradient measures 4.1 mmHg. Aortic valve peak gradient measures 6.7 mmHg. Aortic valve area, by VTI measures 2.80 cm. Pulmonic Valve: The pulmonic valve was not well visualized. Pulmonic valve regurgitation is not visualized. No evidence of pulmonic stenosis. Aorta: The aortic root is normal in size and structure. Pulmonary Artery: Indeterminant PASP, inadequate TR jet. Venous: The inferior vena cava is normal in size with greater than 50% respiratory variability, suggesting right atrial pressure of 3 mmHg. IAS/Shunts: No atrial level shunt detected by color flow Doppler.  LEFT VENTRICLE PLAX 2D LVIDd:         3.80 cm     Diastology LVIDs:         2.62 cm     LV e' lateral:   5.11 cm/s LV PW:         1.66 cm     LV E/e' lateral: 15.7 LV IVS:        1.70 cm     LV e' medial:    4.79 cm/s LVOT diam:     2.20 cm     LV E/e' medial:  16.7 LV SV:         77 LV SV Index:   37 LVOT Area:     3.80 cm  LV Volumes (MOD) LV vol d, MOD A2C: 66.8 ml LV vol d, MOD A4C: 54.9 ml LV vol s, MOD A2C: 27.0 ml LV vol s, MOD A4C: 23.7 ml LV SV MOD A2C:     39.8 ml LV SV MOD A4C:     54.9 ml LV SV MOD BP:      35.3 ml RIGHT VENTRICLE            IVC RV S prime:     8.81 cm/s  IVC diam: 1.76 cm TAPSE (M-mode): 1.7 cm LEFT ATRIUM           Index       RIGHT ATRIUM            Index LA diam:      3.90 cm 1.88 cm/m  RA Area:     15.00 cm LA Vol (A2C): 22.8 ml 11.02 ml/m RA Volume:   41.50  ml  20.05 ml/m LA Vol (A4C): 48.3 ml 23.34 ml/m  AORTIC VALVE AV Area (Vmax):    2.88 cm AV Area (Vmean):   2.83 cm AV Area (VTI):     2.80 cm AV Vmax:           129.00 cm/s AV Vmean:          96.661 cm/s AV VTI:            0.276 m AV Peak Grad:      6.7 mmHg AV Mean Grad:      4.1 mmHg LVOT Vmax:         97.90 cm/s LVOT Vmean:        71.900 cm/s LVOT VTI:          0.203 m LVOT/AV VTI ratio: 0.74  AORTA Ao Root diam: 3.40 cm Ao Asc diam:  3.60 cm MITRAL VALVE MV Area (PHT): 2.76 cm    SHUNTS MV Decel Time: 275 msec    Systemic VTI:  0.20 m MV E velocity: 80.00 cm/s  Systemic Diam: 2.20 cm MV A velocity: 92.20 cm/s MV E/A ratio:  0.87 Carlyle Dolly MD Electronically signed by Carlyle Dolly MD Signature Date/Time: 03/14/2020/2:45:39 PM    Final      Discharge Exam: Vitals:   03/14/20 1100 03/14/20 1306  BP: (!) 156/102 (!) 134/98  Pulse: 76 70  Resp: 18 16  Temp: (!) 97.5 F (36.4 C) 98.2 F (36.8 C)  SpO2: 96% 96%   Vitals:   03/14/20 0643 03/14/20 0900 03/14/20 1100 03/14/20 1306  BP: (!) 143/88 (!) 150/94 (!) 156/102 (!) 134/98  Pulse: 62 65 76 70  Resp: 20 18 18 16   Temp: 98.3 F (36.8 C) 97.8 F (36.6 C) (!) 97.5 F (36.4 C) 98.2 F (36.8 C)  TempSrc: Oral Oral Oral Oral  SpO2: 95% 99% 96% 96%  Weight:      Height:        General: Pt is alert, awake, not in acute distress Cardiovascular: RRR, S1/S2 +, no rubs, no gallops Respiratory: CTA bilaterally, no wheezing, no rhonchi Abdominal: Soft, NT, ND, bowel sounds + Extremities: no edema, no cyanosis    The results of significant diagnostics from this hospitalization (including imaging, microbiology, ancillary and laboratory) are listed below for reference.     Microbiology: Recent Results (from the past 240 hour(s))  SARS Coronavirus 2 by RT PCR (hospital order, performed in [facility] hospital  lab) Nasopharyngeal Nasopharyngeal Swab     Status: None   Collection Time: 03/13/20 10:01 PM   Specimen: Nasopharyngeal Swab  Result Value Ref Range Status   SARS Coronavirus 2 NEGATIVE NEGATIVE Final    Comment: (NOTE) SARS-CoV-2 target nucleic acids are NOT DETECTED. The SARS-CoV-2 RNA is generally detectable in upper and lower respiratory specimens during the acute phase of infection. The lowest concentration of SARS-CoV-2 viral copies this assay can detect is 250 copies / mL. A negative result does not preclude SARS-CoV-2 infection and should not be used as the sole basis for treatment or other patient management decisions.  A negative result may occur with improper specimen collection / handling, submission of specimen other than nasopharyngeal swab, presence of viral mutation(s) within the areas targeted by this assay, and inadequate number of viral copies (<250 copies / mL). A negative result must be combined with clinical observations, patient history, and epidemiological information. Fact Sheet for Patients:   StrictlyIdeas.no Fact Sheet for Healthcare Providers: BankingDealers.co.za This test is not  yet approved or cleared  by the [place] and has been authorized for detection and/or diagnosis of SARS-CoV-2 by FDA under an Emergency Use Authorization (EUA).  This EUA will remain in effect (meaning this test can be used) for the duration of the COVID-19 declaration under Section 564(b)(1) of the Act, 21 U.S.C. section 360bbb-3(b)(1), unless the authorization is terminated or revoked sooner. Performed at [facility], [address] [place], [place] [address]      Labs: BNP (last 3 results) No results for input(s): BNP in the last 8760 hours. Basic Metabolic Panel: Recent Labs  Lab 03/13/20 1723  NA 134*  K 3.8  CL 94*  CO2 28  GLUCOSE 86  BUN 14  CREATININE 1.08  CALCIUM 8.9   Liver Function  Tests: Recent Labs  Lab 03/13/20 1723  AST 36  ALT 22  ALKPHOS 44  BILITOT 0.7  PROT 7.6  ALBUMIN 4.6   No results for input(s): LIPASE, AMYLASE in the last 168 hours. No results for input(s): AMMONIA in the last 168 hours. CBC: Recent Labs  Lab 03/13/20 1723  WBC 7.5  NEUTROABS 4.5  HGB 13.8  HCT 42.2  MCV 99.5  PLT 186   Cardiac Enzymes: No results for input(s): CKTOTAL, CKMB, CKMBINDEX, TROPONINI in the last 168 hours. BNP: Invalid input(s): POCBNP CBG: No results for input(s): GLUCAP in the last 168 hours. D-Dimer No results for input(s): DDIMER in the last 72 hours. Hgb A1c No results for input(s): HGBA1C in the last 72 hours. Lipid Profile Recent Labs    03/14/20 0532  CHOL 125  HDL 42  LDLCALC 64  TRIG 97  CHOLHDL 3.0   Thyroid function studies No results for input(s): TSH, T4TOTAL, T3FREE, THYROIDAB in the last 72 hours.  Invalid input(s): FREET3 Anemia work up No results for input(s): VITAMINB12, FOLATE, FERRITIN, TIBC, IRON, RETICCTPCT in the last 72 hours. Urinalysis    Component Value Date/Time   COLORURINE YELLOW 03/13/2020 1820   APPEARANCEUR CLEAR 03/13/2020 1820   APPEARANCEUR Hazy 05/25/2012 0908   LABSPEC 1.011 03/13/2020 1820   LABSPEC 1.026 05/25/2012 0908   PHURINE 7.0 03/13/2020 1820   GLUCOSEU NEGATIVE 03/13/2020 1820   GLUCOSEU Negative 05/25/2012 0908   HGBUR NEGATIVE 03/13/2020 1820   BILIRUBINUR NEGATIVE 03/13/2020 1820   BILIRUBINUR Negative 05/25/2012 0908   KETONESUR NEGATIVE 03/13/2020 1820   PROTEINUR NEGATIVE 03/13/2020 1820   NITRITE NEGATIVE 03/13/2020 1820   LEUKOCYTESUR NEGATIVE 03/13/2020 1820   LEUKOCYTESUR Negative 05/25/2012 0908   Sepsis Labs Invalid input(s): PROCALCITONIN,  WBC,  LACTICIDVEN Microbiology Recent Results (from the past 240 hour(s))  SARS Coronavirus 2 by RT PCR (hospital order, performed in [facility] hospital lab) Nasopharyngeal Nasopharyngeal Swab     Status: None   Collection  Time: 03/13/20 10:01 PM   Specimen: Nasopharyngeal Swab  Result Value Ref Range Status   SARS Coronavirus 2 NEGATIVE NEGATIVE Final    Comment: (NOTE) SARS-CoV-2 target nucleic acids are NOT DETECTED. The SARS-CoV-2 RNA is generally detectable in upper and lower respiratory specimens during the acute phase of infection. The lowest concentration of SARS-CoV-2 viral copies this assay can detect is 250 copies / mL. A negative result does not preclude SARS-CoV-2 infection and should not be used as the sole basis for treatment or other patient management decisions.  A negative result may occur with improper specimen collection / handling, submission of specimen other than nasopharyngeal swab, presence of viral mutation(s) within the areas targeted by this assay,  and inadequate number of viral copies (<250 copies / mL). A negative result must be combined with clinical observations, patient history, and epidemiological information. Fact Sheet for Patients:   BoilerBrush.com.cy Fact Sheet for Healthcare Providers: https://pope.com/ This test is not yet approved or cleared  by the [place] FDA and has been authorized for detection and/or diagnosis of SARS-CoV-2 by FDA under an Emergency Use Authorization (EUA).  This EUA will remain in effect (meaning this test can be used) for the duration of the COVID-19 declaration under Section 564(b)(1) of the Act, 21 U.S.C. section 360bbb-3(b)(1), unless the authorization is terminated or revoked sooner. Performed at [facility], [address] [place], [place] [address]      Time coordinating discharge: 35 minutes  SIGNED:   Erick Blinks, DO [facility] 03/14/2020, 3:05 PM  If 7PM-7AM, please contact night-coverage [email or web address]

## 2020-03-14 NOTE — Evaluation (Signed)
Occupational Therapy Evaluation Patient Details Name: Timothy Hester MRN: [id number] DOB: [birth year] Today's Date: 03/14/2020    History of Present Illness DATRON BRAKEBILL is a 69 y.o. male with medical history significant of hypertension, hyperlipidemia, gout and tobacco dependence presented to ED for evaluation of stroke like symptoms.  Patient states that this morning he woke up around 2 AM to use the restroom and found having severe difficulty in moving his left leg and reported dragging of his left leg and the symptoms completely resolved around the morning and he was able to walk without any difficulty.  Patient states that later today he started experiencing numbness in his left half of face and left arm that also continued for about 10 minutes and then resolved.  Patient mentioned that he called a family member who is nurse and was recommended to go to ED for further evaluation.  Patient states that his symptoms are completely resolved at this time and denies fever, chills, headache, blurriness of vision, dizziness, chest pain, shortness of breath, nausea, vomiting, abdominal pain and urinary symptoms.  Patient admits of smoking 1 pack of cigarettes daily.   Clinical Impression   Pt agreeable to OT evaluation this am. Pt performing ADLs independently, initial supervision for managing IV pole. Pt with strength WNL, coordination and sensation are intact. No further OT services required at this time as pt is at his baseline.     Follow Up Recommendations  No OT follow up    Equipment Recommendations  None recommended by OT       Precautions / Restrictions Precautions Precautions: None Restrictions Weight Bearing Restrictions: No      Mobility Bed Mobility Overal bed mobility: Independent                Transfers Overall transfer level: Modified independent Equipment used: Straight cane                      ADL either performed or assessed with clinical  judgement   ADL Overall ADL's : Needs assistance/impaired                     Lower Body Dressing: Modified independent;Sitting/lateral leans   Toilet Transfer: Supervision/safety(SPC)   Toileting- Clothing Manipulation and Hygiene: Modified independent;Sit to/from stand       Functional mobility during ADLs: Supervision/safety;Cane       Vision Baseline Vision/History: Wears glasses Wears Glasses: Reading only Patient Visual Report: No change from baseline Vision Assessment?: No apparent visual deficits            Pertinent Vitals/Pain Pain Assessment: No/denies pain     Hand Dominance Right   Extremity/Trunk Assessment Upper Extremity Assessment Upper Extremity Assessment: Overall WFL for tasks assessed   Lower Extremity Assessment Lower Extremity Assessment: Defer to PT evaluation   Cervical / Trunk Assessment Cervical / Trunk Assessment: Normal   Communication Communication Communication: No difficulties   Cognition Arousal/Alertness: Awake/alert Behavior During Therapy: WFL for tasks assessed/performed Overall Cognitive Status: Within Functional Limits for tasks assessed                                                Home Living Family/patient expects to be discharged to:: Private residence Living Arrangements: Spouse/significant other Available Help at Discharge: Family;Available PRN/intermittently Type of Home: House Home Access: Stairs  to enter Entrance Stairs-Number of Steps: 2 Entrance Stairs-Rails: Right;Left;Can reach both Home Layout: Two level Alternate Level Stairs-Number of Steps: 18   Bathroom Shower/Tub: Teacher, early years/pre: Standard     Home Equipment: [employer] - single point          Prior Functioning/Environment Level of Independence: Independent with assistive device(s)        Comments: SPC for functional mobility     AM-PAC OT "6 Clicks" Daily Activity     Outcome Measure Help  from another person eating meals?: None Help from another person taking care of personal grooming?: None Help from another person toileting, which includes using toliet, bedpan, or urinal?: None Help from another person bathing (including washing, rinsing, drying)?: None Help from another person to put on and taking off regular upper body clothing?: None Help from another person to put on and taking off regular lower body clothing?: None 6 Click Score: 24   End of Session Equipment Utilized During Treatment: [facility])  Activity Tolerance: Patient tolerated treatment well Patient left: in bed;with call bell/phone within reach  OT Visit Diagnosis: Muscle weakness (generalized) (M62.81)                Time: 7741-2878 OT Time Calculation (min): 15 min Charges:  OT General Charges $OT Visit: 1 Visit OT Evaluation $OT Eval Low Complexity: [place], OTR/L  [phone] 03/14/2020, 8:52 AM

## 2020-04-04 NOTE — Pre-Procedure Instructions (Addendum)
[place], [place] - [address] [place] [address] Phone: [phone] Fax: [phone]      Your procedure is scheduled on Monday June 21st .  Report to [facility] Main Entrance "A" at 5:30 A.M., and check in at the Admitting office.  Call this number if you have problems the morning of surgery:  [phone]  Call [phone] if you have any questions prior to your surgery date Monday-Friday 8am-4pm    Remember:  Do not eat or drink after midnight the night before your surgery    Take these medicines the morning of surgery with A SIP OF WATER   allopurinol (ZYLOPRIM) 300 MG   atorvastatin (LIPITOR) 20 MG     As of today, STOP taking any Aspirin (unless otherwise instructed by your surgeon) and Aspirin containing products, Aleve, Naproxen, Ibuprofen, Motrin, Advil, Goody's, BC's, all herbal medications, fish oil, and all vitamins.   WHAT DO I DO ABOUT MY DIABETES MEDICATION?   Marland Kitchen Do not take oral diabetes medicines (metformin pills) the morning of surgery.  HOW TO MANAGE YOUR DIABETES BEFORE AND AFTER SURGERY  Why is it important to control my blood sugar before and after surgery? . Improving blood sugar levels before and after surgery helps healing and can limit problems. . A way of improving blood sugar control is eating a healthy diet by: o  Eating less sugar and carbohydrates o  Increasing activity/exercise o  Talking with your doctor about reaching your blood sugar goals . High blood sugars (greater than 180 mg/dL) can raise your risk of infections and slow your recovery, so you will need to focus on controlling your diabetes during the weeks before surgery. . Make sure that the doctor who takes care of your diabetes knows about your planned surgery including the date and location.  How do I manage my blood sugar before surgery? . Check your blood sugar at least 4 times a day, starting 2 days before surgery,  to make sure that the level is not too high or low. . Check your blood sugar the morning of your surgery when you wake up and every 2 hours until you get to the Short Stay unit. o If your blood sugar is less than 70 mg/dL, you will need to treat for low blood sugar: - Do not take insulin. - Treat a low blood sugar (less than 70 mg/dL) with  cup of clear juice (cranberry or apple), 4 glucose tablets, OR glucose gel. - Recheck blood sugar in 15 minutes after treatment (to make sure it is greater than 70 mg/dL). If your blood sugar is not greater than 70 mg/dL on recheck, call [phone] for further instructions. . Report your blood sugar to the short stay nurse when you get to Short Stay.  . If you are admitted to the hospital after surgery: o Your blood sugar will be checked by the staff and you will probably be given insulin after surgery (instead of oral diabetes medicines) to make sure you have good blood sugar levels. o The goal for blood sugar control after surgery is 80-180 mg/dL.              Do not wear jewelry            Do not wear lotions, powders, colognes, or deodorant.            Do not Men may shave face and neck.  Do not bring valuables to the hospital.            [facility] is not responsible for any belongings or valuables.  Do NOT Smoke (Tobacco/Vapping) or drink Alcohol 24 hours prior to your procedure If you use a CPAP at night, you may bring all equipment for your overnight stay.   Contacts, glasses, dentures or bridgework may not be worn into surgery.      For patients admitted to the hospital, discharge time will be determined by your treatment team.   Patients discharged the day of surgery will not be allowed to drive home, and someone needs to stay with them for 24 hours.    Special instructions:   [facility]- Preparing For Surgery  Before surgery, you can play an important role. Because skin is not sterile, your skin needs to be as free of  germs as possible. You can reduce the number of germs on your skin by washing with CHG (chlorahexidine gluconate) Soap before surgery.  CHG is an antiseptic cleaner which kills germs and bonds with the skin to continue killing germs even after washing.    Oral Hygiene is also important to reduce your risk of infection.  Remember - BRUSH YOUR TEETH THE MORNING OF SURGERY WITH YOUR REGULAR TOOTHPASTE  Please do not use if you have an allergy to CHG or antibacterial soaps. If your skin becomes reddened/irritated stop using the CHG.  Do not shave (including legs and underarms) for at least 48 hours prior to first CHG shower. It is OK to shave your face.  Please follow these instructions carefully.   1. Shower the NIGHT BEFORE SURGERY and the MORNING OF SURGERY with CHG Soap.   2. If you chose to wash your hair, wash your hair first as usual with your normal shampoo.  3. After you shampoo, rinse your hair and body thoroughly to remove the shampoo.  4. Use CHG as you would any other liquid soap. You can apply CHG directly to the skin and wash gently with a scrungie or a clean washcloth.   5. Apply the CHG Soap to your body ONLY FROM THE NECK DOWN.  Do not use on open wounds or open sores. Avoid contact with your eyes, ears, mouth and genitals (private parts). Wash Face and genitals (private parts)  with your normal soap.   6. Wash thoroughly, paying special attention to the area where your surgery will be performed.  7. Thoroughly rinse your body with warm water from the neck down.  8. DO NOT shower/wash with your normal soap after using and rinsing off the CHG Soap.  9. Pat yourself dry with a CLEAN TOWEL.  10. Wear CLEAN PAJAMAS to bed the night before surgery, wear comfortable clothes the morning of surgery  11. Place CLEAN SHEETS on your bed the night of your first shower and DO NOT SLEEP WITH PETS.   Day of Surgery:   Do not apply any deodorants/lotions.  Please wear clean clothes  to the hospital/surgery center.   Remember to brush your teeth WITH YOUR REGULAR TOOTHPASTE.   Please read over the following fact sheets that you were given.

## 2020-04-05 ENCOUNTER — Other Ambulatory Visit (HOSPITAL_COMMUNITY): Payer: Medicare Other

## 2020-04-05 ENCOUNTER — Other Ambulatory Visit: Payer: Self-pay

## 2020-04-05 ENCOUNTER — Encounter (HOSPITAL_COMMUNITY): Payer: Self-pay

## 2020-04-05 ENCOUNTER — Encounter (HOSPITAL_COMMUNITY)
Admission: RE | Admit: 2020-04-05 | Discharge: 2020-04-05 | Disposition: A | Discharge status: Home / Self Care | Payer: Medicare Other | Source: Ambulatory Visit | Attending: Neurological Surgery | Admitting: Neurological Surgery

## 2020-04-05 ENCOUNTER — Ambulatory Visit (HOSPITAL_COMMUNITY)
Admission: RE | Admit: 2020-04-05 | Discharge: 2020-04-05 | Disposition: A | Discharge status: Home / Self Care | Payer: Medicare Other | Source: Ambulatory Visit | Attending: Neurological Surgery | Admitting: Neurological Surgery

## 2020-04-05 DIAGNOSIS — Z01818 Encounter for other preprocedural examination: Secondary | ICD-10-CM | POA: Diagnosis not present

## 2020-04-05 DIAGNOSIS — M48061 Spinal stenosis, lumbar region without neurogenic claudication: Secondary | ICD-10-CM | POA: Insufficient documentation

## 2020-04-05 HISTORY — DX: Other complications of anesthesia, initial encounter: T88.59XA

## 2020-04-05 HISTORY — DX: Personal history of urinary calculi: Z87.442

## 2020-04-05 HISTORY — DX: Pneumonia, unspecified organism: J18.9

## 2020-04-05 HISTORY — DX: Prediabetes: R73.03

## 2020-04-05 LAB — CBC WITH DIFFERENTIAL/PLATELET
Abs Immature Granulocytes: 0.06 10*3/uL (ref 0.00–0.07)
Basophils Absolute: 0.1 10*3/uL (ref 0.0–0.1)
Basophils Relative: 1 %
Eosinophils Absolute: 0.4 10*3/uL (ref 0.0–0.5)
Eosinophils Relative: 5 %
HCT: 42.8 % (ref 39.0–52.0)
Hemoglobin: 14 g/dL (ref 13.0–17.0)
Immature Granulocytes: 1 %
Lymphocytes Relative: 19 %
Lymphs Abs: 1.6 10*3/uL (ref 0.7–4.0)
MCH: 32.6 pg (ref 26.0–34.0)
MCHC: 32.7 g/dL (ref 30.0–36.0)
MCV: 99.5 fL (ref 80.0–100.0)
Monocytes Absolute: 0.8 10*3/uL (ref 0.1–1.0)
Monocytes Relative: 9 %
Neutro Abs: 5.8 10*3/uL (ref 1.7–7.7)
Neutrophils Relative %: 65 %
Platelets: 194 10*3/uL (ref 150–400)
RBC: 4.3 MIL/uL (ref 4.22–5.81)
RDW: 14.4 % (ref 11.5–15.5)
WBC: 8.7 10*3/uL (ref 4.0–10.5)
nRBC: 0 % (ref 0.0–0.2)

## 2020-04-05 LAB — BASIC METABOLIC PANEL
Anion gap: 10 (ref 5–15)
BUN: 14 mg/dL (ref 8–23)
CO2: 29 mmol/L (ref 22–32)
Calcium: 9.2 mg/dL (ref 8.9–10.3)
Chloride: 97 mmol/L — ABNORMAL LOW (ref 98–111)
Creatinine, Ser: 1.29 mg/dL — ABNORMAL HIGH (ref 0.61–1.24)
GFR calc Af Amer: >60 mL/min (ref >60)
GFR calc non Af Amer: 56 mL/min — ABNORMAL LOW (ref >60)
Glucose, Bld: 92 mg/dL (ref 70–99)
Potassium: 4.1 mmol/L (ref 3.5–5.1)
Sodium: 136 mmol/L (ref 135–145)

## 2020-04-05 LAB — GLUCOSE, CAPILLARY: Glucose-Capillary: 126 mg/dL — ABNORMAL HIGH (ref 70–99)

## 2020-04-05 LAB — SURGICAL PCR SCREEN
MRSA, PCR: NEGATIVE
Staphylococcus aureus: NEGATIVE

## 2020-04-05 LAB — PROTIME-INR
INR: 1 (ref 0.8–1.2)
Prothrombin Time: 12.9 seconds (ref 11.4–15.2)

## 2020-04-05 IMAGING — CR DG CHEST 2V
2 series · 2 of 2 positions shown · non-contrast
Comparison: [DATE]

CLINICAL DATA: Preop

EXAM:
CHEST - 2 VIEW

[w chest pa]
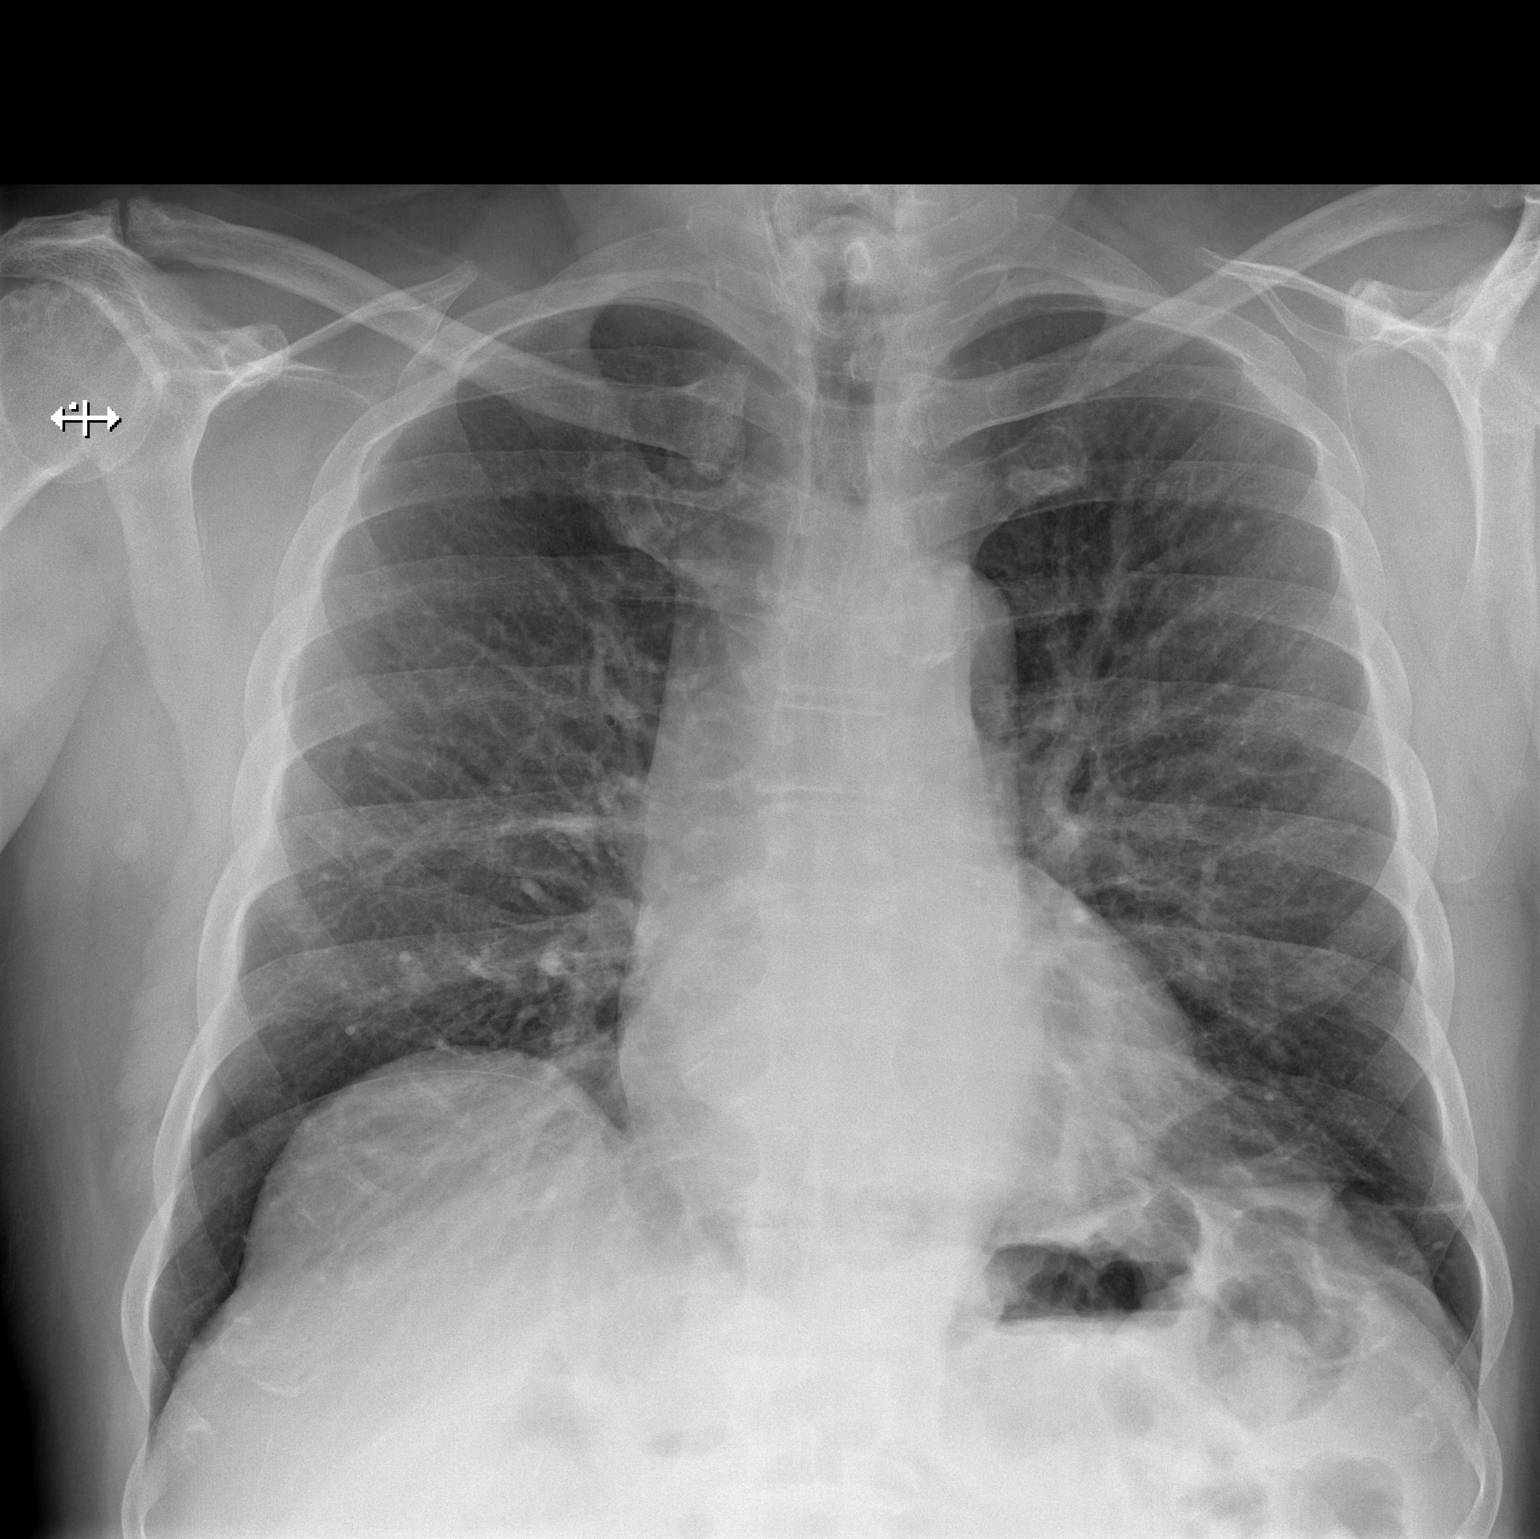

[w chest lat]
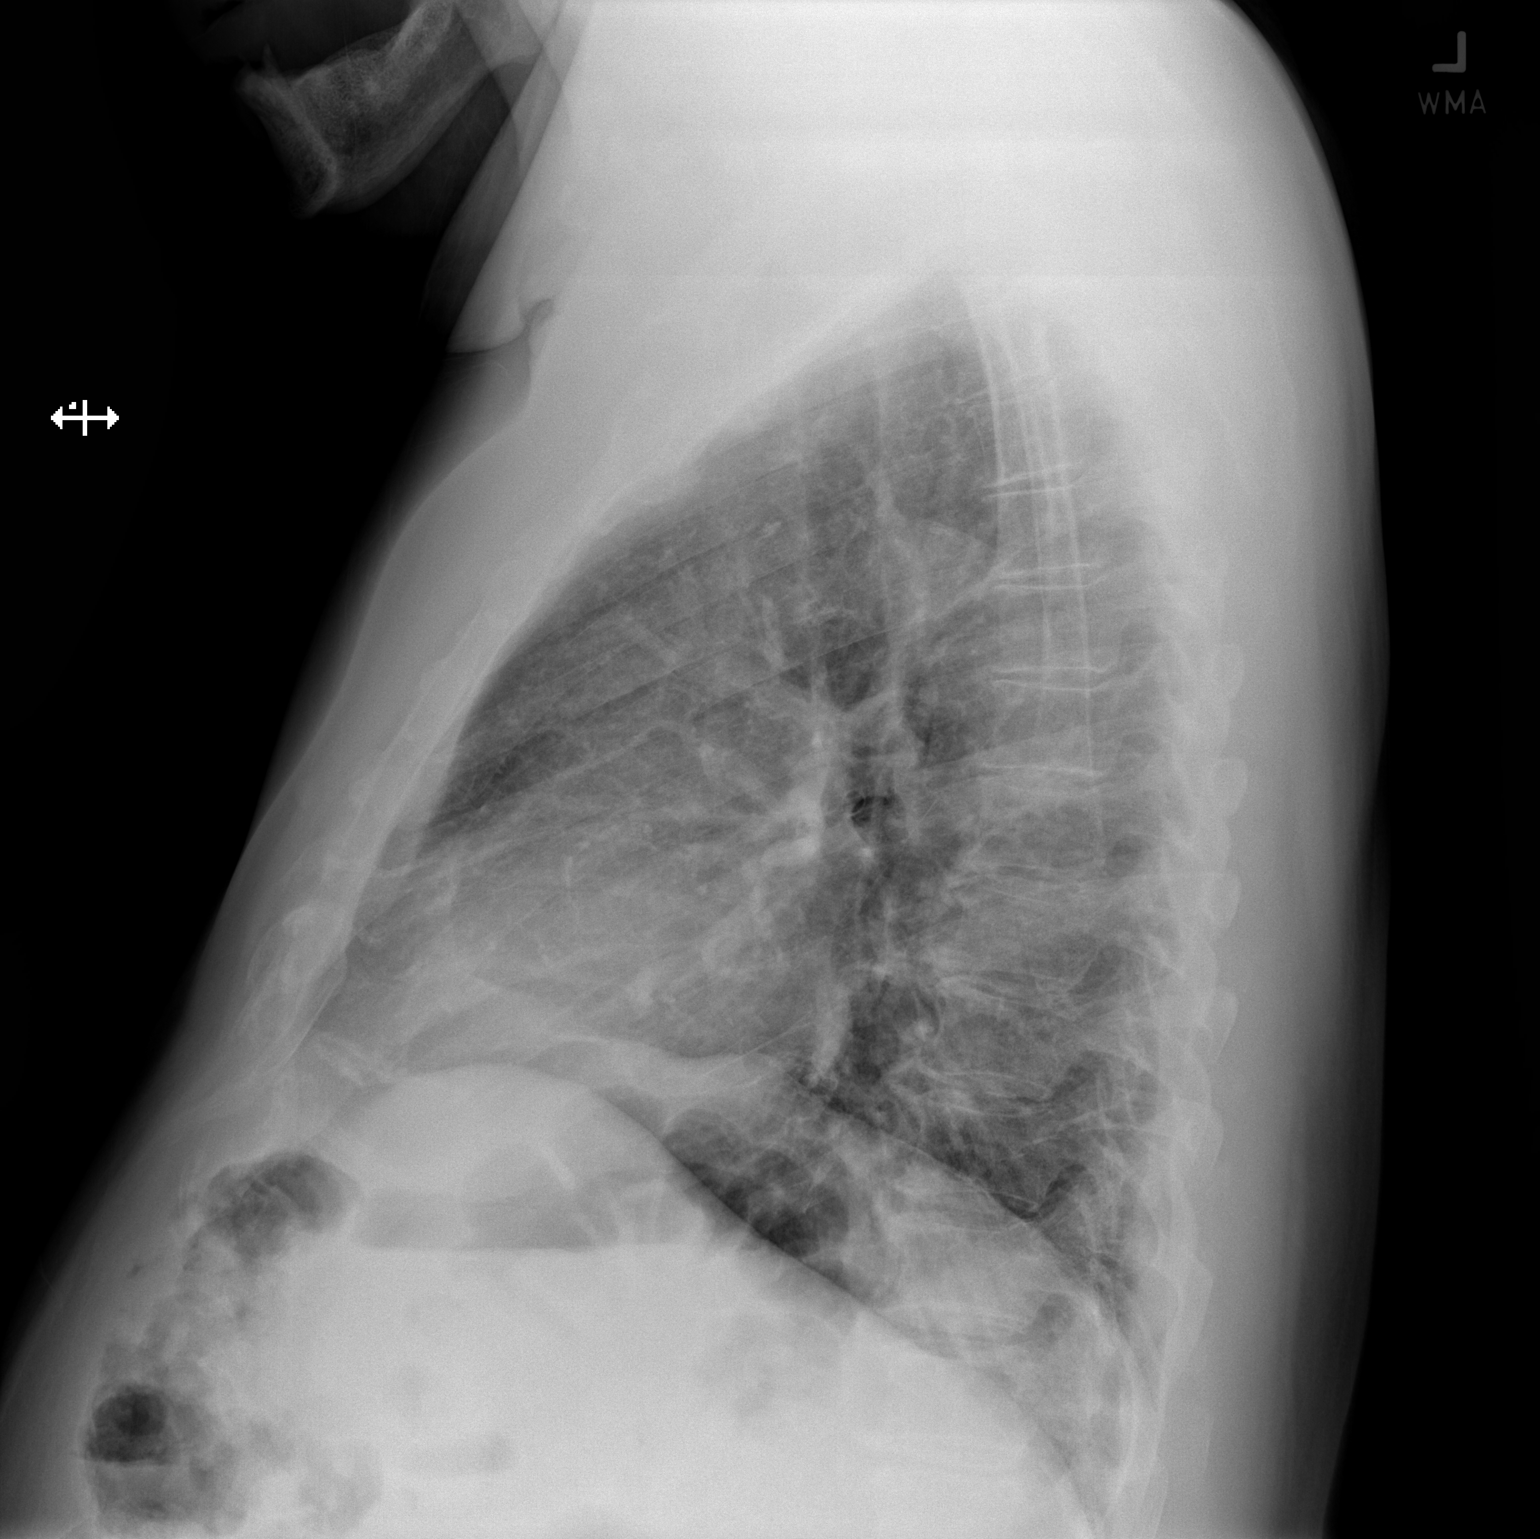

[2 of 2 positions shown; findings below may reference images not displayed]

FINDINGS: Heart and mediastinal contours are within normal limits. No focal
opacities or effusions. No acute bony abnormality. Aortic
atherosclerosis.
IMPRESSION: No active cardiopulmonary disease.
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
## 2020-04-09 ENCOUNTER — Telehealth: Payer: Self-pay

## 2020-04-09 DIAGNOSIS — M48061 Spinal stenosis, lumbar region without neurogenic claudication: Secondary | ICD-10-CM

## 2020-04-09 NOTE — Telephone Encounter (Signed)
Irving Burton can be reached back at [phone] ext. [phone]

## 2020-04-09 NOTE — Telephone Encounter (Signed)
I received a phone call from [facility] with [facility] Neurology. She states [facility] is wanting to preform back surgery on this patient, and they are needing a clearance from [facility] Neurology. Irving Burton states that they cannot complete this clearance because he has never been referred to their office. Dr. Para March, please advise.

## 2020-04-10 NOTE — Addendum Note (Signed)
Addended by: Joaquim Nam on: 04/10/2020 09:34 PM   Modules accepted: Orders

## 2020-04-10 NOTE — Telephone Encounter (Signed)
I put in the referral.  This is not the same thing is me signing off on the surgery because I have not evaluated the patient since he got out of the hospital.  I do need to get all of the notes from [facility] and [facility] neurology.  Please request those.

## 2020-04-11 NOTE — Telephone Encounter (Signed)
Called [facility] and spoke to [facility] Dr Yetta Barre surgery scheduler. Patient went to [facility] on 03/13/2020 and Dr Gerilyn Pilgrim from [facility] Neurology saw him in the hospital. Dr Gerilyn Pilgrim prescribed plavix for the patient and was supposed to see him at his office to followup after the hospital stay. Patient never took the plavix nor did he followup with Dr Gerilyn Pilgrim. Anesthesia is needing Clearance from Neurology not Primary Care and Dr Yetta Barre office has been working on this since last week. They are in touch with [facility] Neurology and are working to try to get this appointment scheduled. There is nothing our office needs to do to facilitate the appointment with Neurology because we cant clear the patient. Erie Noe thanked [place] for calling and trying to help this patient but his surgery will need to be canceled until Dr Harriett Sine office gets this patient seen.Canceling Neurology referral

## 2020-04-12 ENCOUNTER — Inpatient Hospital Stay (HOSPITAL_COMMUNITY): Admission: RE | Admit: 2020-04-12 | Payer: Medicare Other | Source: Ambulatory Visit

## 2020-04-12 NOTE — Telephone Encounter (Signed)
Noted. Thanks.

## 2020-05-02 ENCOUNTER — Ambulatory Visit: Payer: Medicare Other | Admitting: Family Medicine

## 2020-05-28 ENCOUNTER — Encounter: Payer: Self-pay | Admitting: Family Medicine

## 2020-05-28 ENCOUNTER — Ambulatory Visit (INDEPENDENT_AMBULATORY_CARE_PROVIDER_SITE_OTHER): Payer: Medicare Other | Admitting: Family Medicine

## 2020-05-28 ENCOUNTER — Other Ambulatory Visit: Payer: Self-pay

## 2020-05-28 VITALS — BP 122/80 | HR 83 | Temp 96.8°F | Ht 67.0 in | Wt 209.1 lb

## 2020-05-28 DIAGNOSIS — G459 Transient cerebral ischemic attack, unspecified: Secondary | ICD-10-CM | POA: Diagnosis not present

## 2020-05-28 DIAGNOSIS — I10 Essential (primary) hypertension: Secondary | ICD-10-CM | POA: Diagnosis not present

## 2020-05-28 DIAGNOSIS — I639 Cerebral infarction, unspecified: Secondary | ICD-10-CM | POA: Diagnosis not present

## 2020-05-28 DIAGNOSIS — R739 Hyperglycemia, unspecified: Secondary | ICD-10-CM

## 2020-05-28 DIAGNOSIS — M109 Gout, unspecified: Secondary | ICD-10-CM | POA: Diagnosis not present

## 2020-05-28 LAB — POCT GLYCOSYLATED HEMOGLOBIN (HGB A1C): Hemoglobin A1C: 6.3 % — AB (ref 4.0–5.6)

## 2020-05-28 NOTE — Patient Instructions (Addendum)
Go to the lab on the way out.   If you have mychart we'll likely use that to update you.     Don't change your meds for now.  However, if you are lightheaded cut losartan back to 1/2 tab a day.  If your BP is consistently above 140/above 90, then increase losartan to 1.5 tabs a day.   Take care.  Glad to see you.

## 2020-05-28 NOTE — Progress Notes (Signed)
Patient was placed on Clopidogrel in the hospital but discontinued it himself after 7 days.

## 2020-05-28 NOTE — Progress Notes (Signed)
This visit occurred during the SARS-CoV-2 public health emergency.  Safety protocols were in place, including screening questions prior to the visit, additional usage of staff PPE, and extensive cleaning of exam room while observing appropriate contact time as indicated for disinfecting solutions.  Recent events discussed with patient, meaning events of the last few months.  He had TIA symptoms with unilateral neurologic changes that quickly resolved.  He had inpatient work-up done with echo/MRI/carotid Dopplers.  He had elevated blood pressure at the time and has been treated with escalating doses of antihypertensive medications.  He has not had any new stroke symptoms in the meantime.  He does not have any residual symptoms from previous TIA.  He did have his Covid vaccine.  Moderna, 2 doses none.  He is unclear the dates.  He will update me about that.  He has spinal stenosis but he has not had that addressed yet.  Surgery was put off because of his TIA and also because of hypertension.  He is talked with the surgery clinic and the plan is for surgery to be done next week, assuming there is no contraindication otherwise.  Back surgery is scheduled for 06/07/20.  He has been putting out with significant amounts of pain.  He is still unsure if he wants to go through with surgery.  Per patient, he was advised by the neurosurgery clinic that intervention short of surgery would likely not be beneficial.  We talked about the risks and benefits of surgery.  He is aware that surgery cannot be made completely risk-free.  Without surgery he would likely continue to have significant back pain and lifestyle limitation from that.  My goal is to limit his risk of another TIA/stroke, regardless of whether or not he has surgery.  If he is going to have surgery then the goal is to lower his perioperative risk as much as possible.  Discussed.  He understood.  We talked about rationale for his current medications.  He has  had some lower blood pressures recently noted and has been occasionally lightheaded on standing when his blood pressure was lower.  He is not lightheaded right now.  His losartan was previously increased above 50 mg a day but he had lower blood pressures noted.  His blood pressure has been reasonable now that he is on 50 mg a day.  See medication list.  TIA/stroke pathophysiology and risk discussed with patient.  Current Outpatient Medications on File Prior to Visit  Medication Sig Dispense Refill  . allopurinol (ZYLOPRIM) 300 MG tablet Take 300 mg by mouth daily.    Marland Kitchen amLODipine (NORVASC) 5 MG tablet Take 5 mg by mouth daily.    Marland Kitchen aspirin EC 81 MG tablet Take 81 mg by mouth daily.    Marland Kitchen atorvastatin (LIPITOR) 20 MG tablet Take 20 mg by mouth daily.    Marland Kitchen losartan (COZAAR) 50 MG tablet Take 50 mg by mouth daily. .    . metFORMIN (GLUCOPHAGE-XR) 500 MG 24 hr tablet Take 500 mg by mouth daily.     Marland Kitchen OVER THE COUNTER MEDICATION Apply 1 application topically as needed (pain). Hemp Cream    . triamterene-hydrochlorothiazide (DYAZIDE) 37.5-25 MG capsule Take 1 capsule by mouth daily.     No current facility-administered medications on file prior to visit.     Meds, vitals, and allergies reviewed.   ROS: Per HPI unless specifically indicated in ROS section   GEN: nad, alert and oriented HEENT: ncat NECK: supple w/o LA  CV: rrr. PULM: ctab, no inc wob ABD: soft, +bs EXT: no edema SKIN: no acute rash CN 2-12 wnl B, S/S wnl x4 Walking with cane at baseline.

## 2020-05-29 DIAGNOSIS — G459 Transient cerebral ischemic attack, unspecified: Secondary | ICD-10-CM | POA: Insufficient documentation

## 2020-05-29 LAB — CBC WITH DIFFERENTIAL/PLATELET
Basophils Absolute: 0.1 10*3/uL (ref 0.0–0.1)
Basophils Relative: 1 % (ref 0.0–3.0)
Eosinophils Absolute: 0.4 10*3/uL (ref 0.0–0.7)
Eosinophils Relative: 3.9 % (ref 0.0–5.0)
HCT: 42 % (ref 39.0–52.0)
Hemoglobin: 14.4 g/dL (ref 13.0–17.0)
Lymphocytes Relative: 24.5 % (ref 12.0–46.0)
Lymphs Abs: 2.8 10*3/uL (ref 0.7–4.0)
MCHC: 34.2 g/dL (ref 30.0–36.0)
MCV: 99.9 fl (ref 78.0–100.0)
Monocytes Absolute: 0.8 10*3/uL (ref 0.1–1.0)
Monocytes Relative: 7.2 % (ref 3.0–12.0)
Neutro Abs: 7.1 10*3/uL (ref 1.4–7.7)
Neutrophils Relative %: 63.4 % (ref 43.0–77.0)
Platelets: 238 10*3/uL (ref 150.0–400.0)
RBC: 4.21 Mil/uL — ABNORMAL LOW (ref 4.22–5.81)
RDW: 14.4 % (ref 11.5–15.5)
WBC: 11.3 10*3/uL — ABNORMAL HIGH (ref 4.0–10.5)

## 2020-05-29 LAB — URIC ACID: Uric Acid, Serum: 5.4 mg/dL (ref 4.0–7.8)

## 2020-05-29 LAB — COMPREHENSIVE METABOLIC PANEL
ALT: 13 U/L (ref 0–53)
AST: 22 U/L (ref 0–37)
Albumin: 4.9 g/dL (ref 3.5–5.2)
Alkaline Phosphatase: 47 U/L (ref 39–117)
BUN: 26 mg/dL — ABNORMAL HIGH (ref 6–23)
CO2: 29 mEq/L (ref 19–32)
Calcium: 10.1 mg/dL (ref 8.4–10.5)
Chloride: 93 mEq/L — ABNORMAL LOW (ref 96–112)
Creatinine, Ser: 1.78 mg/dL — ABNORMAL HIGH (ref 0.40–1.50)
GFR: 38.02 mL/min — ABNORMAL LOW (ref >60.00)
Glucose, Bld: 89 mg/dL (ref 70–99)
Potassium: 4.7 mEq/L (ref 3.5–5.1)
Sodium: 130 mEq/L — ABNORMAL LOW (ref 135–145)
Total Bilirubin: 0.4 mg/dL (ref 0.2–1.2)
Total Protein: 8.1 g/dL (ref 6.0–8.3)

## 2020-05-29 NOTE — Assessment & Plan Note (Addendum)
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  Stroke versus TIA pathophysiology and risk discussed with patient.  With history of hypertension noted.  No change in medications at this point.  Continue losartan 50 mg a day for now.  His blood pressure is reasonable today and he is not lightheaded today. However, if lightheaded cut losartan back to 1/2 tab a day (25 mg a day).  If BP is consistently above 140/above 90, then increase losartan to 1.5 tabs a day (75 mg a day).   Continue amlodipine, Lipitor, triamterene hydrochlorothiazide, and aspirin.   Reasonable to recheck labs.  We may need to adjust his medication based on his labs.  At this point I did not tell him that he could not have surgery.  I think it makes sense to check his labs first and make sure his blood pressure is reasonable.  If he wants to go through surgery after we have gathered all relevant data and I will defer to him in the surgery clinic.  See above.  He agrees with plan.  At least 30 minutes were devoted to patient care in this encounter (this can potentially include time spent reviewing the patient's file/history, interviewing and examining the patient, counseling/reviewing plan with patient, ordering referrals, ordering tests, reviewing relevant laboratory or x-ray data, and documenting the encounter).

## 2020-05-30 ENCOUNTER — Other Ambulatory Visit: Payer: Self-pay | Admitting: Family Medicine

## 2020-05-30 DIAGNOSIS — I10 Essential (primary) hypertension: Secondary | ICD-10-CM

## 2020-05-30 MED ORDER — AMLODIPINE BESYLATE 5 MG PO TABS: 10.0000 mg | ORAL_TABLET | Freq: Every day | ORAL | Status: DC

## 2020-05-31 ENCOUNTER — Telehealth: Payer: Self-pay | Admitting: Family Medicine

## 2020-05-31 NOTE — Telephone Encounter (Signed)
Patient called in to go over results. Please advise.

## 2020-05-31 NOTE — Telephone Encounter (Signed)
Phoned wife.

## 2020-06-04 ENCOUNTER — Other Ambulatory Visit: Payer: Self-pay

## 2020-06-04 ENCOUNTER — Other Ambulatory Visit (INDEPENDENT_AMBULATORY_CARE_PROVIDER_SITE_OTHER): Payer: Medicare Other

## 2020-06-04 DIAGNOSIS — I10 Essential (primary) hypertension: Secondary | ICD-10-CM | POA: Diagnosis not present

## 2020-06-04 LAB — BASIC METABOLIC PANEL
BUN: 15 mg/dL (ref 6–23)
CO2: 32 mEq/L (ref 19–32)
Calcium: 9.5 mg/dL (ref 8.4–10.5)
Chloride: 94 mEq/L — ABNORMAL LOW (ref 96–112)
Creatinine, Ser: 1.59 mg/dL — ABNORMAL HIGH (ref 0.40–1.50)
GFR: 43.3 mL/min — ABNORMAL LOW (ref >60.00)
Glucose, Bld: 96 mg/dL (ref 70–99)
Potassium: 3.9 mEq/L (ref 3.5–5.1)
Sodium: 134 mEq/L — ABNORMAL LOW (ref 135–145)

## 2020-06-05 ENCOUNTER — Encounter (HOSPITAL_COMMUNITY)
Admission: RE | Admit: 2020-06-05 | Discharge: 2020-06-05 | Disposition: A | Discharge status: Home / Self Care | Payer: Medicare Other | Source: Ambulatory Visit | Attending: Neurological Surgery | Admitting: Neurological Surgery

## 2020-06-05 ENCOUNTER — Encounter (HOSPITAL_COMMUNITY): Payer: Self-pay

## 2020-06-05 ENCOUNTER — Other Ambulatory Visit (HOSPITAL_COMMUNITY)
Admission: RE | Admit: 2020-06-05 | Discharge: 2020-06-05 | Disposition: A | Discharge status: Home / Self Care | Payer: Medicare Other | Source: Ambulatory Visit | Attending: Neurological Surgery | Admitting: Neurological Surgery

## 2020-06-05 ENCOUNTER — Other Ambulatory Visit: Payer: Self-pay

## 2020-06-05 DIAGNOSIS — Z20822 Contact with and (suspected) exposure to covid-19: Secondary | ICD-10-CM | POA: Insufficient documentation

## 2020-06-05 DIAGNOSIS — Z01812 Encounter for preprocedural laboratory examination: Secondary | ICD-10-CM | POA: Insufficient documentation

## 2020-06-05 LAB — SARS CORONAVIRUS 2 (TAT 6-24 HRS): SARS Coronavirus 2: NEGATIVE

## 2020-06-05 LAB — SURGICAL PCR SCREEN
MRSA, PCR: NEGATIVE
Staphylococcus aureus: NEGATIVE

## 2020-06-05 NOTE — Pre-Procedure Instructions (Signed)
[facility] [employer] - [place], [place] - [address] [place] [address] Phone: [phone] Fax: [phone]      Your procedure is scheduled on Friday August 13th.  Report to [facility] Main Entrance "A" at 5:30 A.M., and check in at the Admitting office.  Call this number if you have problems the morning of surgery:  [phone]  Call [phone] if you have any questions prior to your surgery date Monday-Friday 8am-4pm    Remember:  Do not eat or drink after midnight the night before your surgery    Take these medicines the morning of surgery with A SIP OF WATER   allopurinol (ZYLOPRIM) 300 MG tablet  amLODipine (NORVASC) 5 MG tablet  atorvastatin (LIPITOR) 20 MG tablet  Do not take your metformin day of surgery   As of today, STOP taking any Aspirin (unless otherwise instructed by your surgeon) Aleve, Naproxen, Ibuprofen, Motrin, Advil, Goody's, BC's, all herbal medications, fish oil, and all vitamins.                      Do not wear jewelry, make up, or nail polish            Do not wear lotions, powders, colognes, or deodorant.            Do not shave 48 hours prior to surgery.  Men may shave face and neck.            Do not bring valuables to the hospital.            [facility] is not responsible for any belongings or valuables.  Do NOT Smoke (Tobacco/Vaping) or drink Alcohol 24 hours prior to your procedure If you use a CPAP at night, you may bring all equipment for your overnight stay.   Contacts, glasses, dentures or bridgework may not be worn into surgery.      For patients admitted to the hospital, discharge time will be determined by your treatment team.   Patients discharged the day of surgery will not be allowed to drive home, and someone needs to stay with them for 24 hours.    Special instructions:   [facility]- Preparing For Surgery  Before surgery, you can play an important role. Because skin is not sterile,  your skin needs to be as free of germs as possible. You can reduce the number of germs on your skin by washing with CHG (chlorahexidine gluconate) Soap before surgery.  CHG is an antiseptic cleaner which kills germs and bonds with the skin to continue killing germs even after washing.      Please do not use if you have an allergy to CHG or antibacterial soaps. If your skin becomes reddened/irritated stop using the CHG.  Do not shave (including legs and underarms) for at least 48 hours prior to first CHG shower. It is OK to shave your face.  Please follow these instructions carefully.   1. Shower the NIGHT BEFORE SURGERY and the MORNING OF SURGERY with CHG Soap.   2. If you chose to wash your hair, wash your hair first as usual with your normal shampoo.  3. After you shampoo, rinse your hair and body thoroughly to remove the shampoo.  4. Use CHG as you would any other liquid soap. You can apply CHG directly to the skin and wash gently with a scrungie or a clean washcloth.   5. Apply the CHG Soap to your body  ONLY FROM THE NECK DOWN.  Do not use on open wounds or open sores. Avoid contact with your eyes, ears, mouth and genitals (private parts). Wash Face and genitals (private parts)  with your normal soap.   6. Wash thoroughly, paying special attention to the area where your surgery will be performed.  7. Thoroughly rinse your body with warm water from the neck down.  8. DO NOT shower/wash with your normal soap after using and rinsing off the CHG Soap.  9. Pat yourself dry with a CLEAN TOWEL.  10. Wear CLEAN PAJAMAS to bed the night before surgery  11. Place CLEAN SHEETS on your bed the night of your first shower and DO NOT SLEEP WITH PETS.  Oral Hygiene is also important to reduce your risk of infection.  Remember - BRUSH YOUR TEETH THE MORNING OF SURGERY WITH YOUR REGULAR TOOTHPASTE  Day of Surgery: Wear Clean/Comfortable clothing the morning of surgery Do not apply any  deodorants/lotions.   Remember to brush your teeth WITH YOUR REGULAR TOOTHPASTE.   Please read over the following fact sheets that you were given.
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
## 2020-06-06 NOTE — Anesthesia Preprocedure Evaluation (Addendum)
Anesthesia Evaluation  Patient identified by MRN, date of birth, ID band Patient awake  General Assessment Comment:Hard of hearing  Reviewed: Allergy & Precautions, NPO status , Patient's Chart, lab work & pertinent test results  History of Anesthesia Complications (+) DIFFICULT AIRWAY and history of anesthetic complications  Airway Mallampati: IV  TM Distance: >3 FB Neck ROM: Limited    Dental  (+) Missing, Poor Dentition, Edentulous Upper   Pulmonary neg shortness of breath, neg sleep apnea, neg COPD, neg recent URI, Current Smoker and Patient abstained from smoking.,    breath sounds clear to auscultation       Cardiovascular hypertension, Pt. on medications (-) angina(-) Past MI and (-) CHF  Rhythm:Regular  1. Left ventricular ejection fraction, by estimation, is 60 to 65%. The  left ventricle has normal function. The left ventricle has no regional  wall motion abnormalities. There is severe left ventricular hypertrophy.  Left ventricular diastolic parameters  are indeterminate.  2. Right ventricular systolic function is normal. The right ventricular  size is normal.  3. The mitral valve is normal in structure. Trivial mitral valve  regurgitation. No evidence of mitral stenosis.  4. The aortic valve is tricuspid. Aortic valve regurgitation is not  visualized. No aortic stenosis is present.  5. The inferior vena cava is normal in size with greater than 50%  respiratory variability, suggesting right atrial pressure of 3 mmHg.   Neuro/Psych neg Seizures TIAnegative psych ROS   GI/Hepatic negative GI ROS, Neg liver ROS,   Endo/Other  diabetes, Oral Hypoglycemic Agents  Renal/GU CRFRenal disease     Musculoskeletal   Abdominal   Peds  Hematology negative hematology ROS (+)   Anesthesia Other Findings   Reproductive/Obstetrics                            Anesthesia Physical Anesthesia  Plan  ASA: III  Anesthesia Plan: General   Post-op Pain Management:    Induction: Intravenous  PONV Risk Score and Plan: 1 and Ondansetron and Dexamethasone  Airway Management Planned: Oral ETT and Video Laryngoscope Planned  Additional Equipment: None  Intra-op Plan:   Post-operative Plan: Extubation in OR  Informed Consent: I have reviewed the patients History and Physical, chart, labs and discussed the procedure including the risks, benefits and alternatives for the proposed anesthesia with the patient or authorized representative who has indicated his/her understanding and acceptance.     Dental advisory given  Plan Discussed with: CRNA and Surgeon  Anesthesia Plan Comments: (See recent PAT note by Antionette Poles, PA-C 04/05/20 (pt evaluated by Dr. Mal Amabile at that time and discussed possible elective use of glidescope). Case postponed at that time due to recent TIA. In the interim, pt seen and cleared by neurologist Dr. Gerilyn Pilgrim as moderate risk, copy on chart. Also had recent followup with PCP Dr. Para March and discussed upcoming surgery, labs were done which showed stable CKD with creatinine 1.59. Remainder of labs unremarkable.   EKG 03/13/20: Sinus rhythm. Rate 73. Prolonged PR interval. Low voltage, precordial leads. Nonspecific T abnormalities, lateral leads. Borderline prolonged QT interval. No significant change since last tracing  TTE 03/14/20: 1. Left ventricular ejection fraction, by estimation, is 60 to 65%. The  left ventricle has normal function. The left ventricle has no regional  wall motion abnormalities. There is severe left ventricular hypertrophy.  Left ventricular diastolic parameters  are indeterminate.  2. Right ventricular systolic function is normal. The right ventricular  size is normal.  3. The mitral valve is normal in structure. Trivial mitral valve  regurgitation. No evidence of mitral stenosis.  4. The aortic valve is tricuspid. Aortic valve  regurgitation is not  visualized. No aortic stenosis is present.  5. The inferior vena cava is normal in size with greater than 50%  respiratory variability, suggesting right atrial pressure of 3 mmHg.  )      Anesthesia Quick Evaluation

## 2020-06-07 ENCOUNTER — Encounter (HOSPITAL_COMMUNITY)
Admission: RE | Disposition: A | Discharge status: Home / Self Care | Payer: Self-pay | Source: Home / Self Care | Attending: Neurological Surgery

## 2020-06-07 ENCOUNTER — Ambulatory Visit (HOSPITAL_COMMUNITY)
Admission: RE | Admit: 2020-06-07 | Discharge: 2020-06-07 | Disposition: A | Discharge status: Home / Self Care | Payer: Medicare Other | Attending: Neurological Surgery | Admitting: Neurological Surgery

## 2020-06-07 ENCOUNTER — Encounter (HOSPITAL_COMMUNITY): Payer: Self-pay | Admitting: Neurological Surgery

## 2020-06-07 ENCOUNTER — Ambulatory Visit (HOSPITAL_COMMUNITY): Payer: Medicare Other | Admitting: Physician Assistant

## 2020-06-07 ENCOUNTER — Ambulatory Visit (HOSPITAL_COMMUNITY): Payer: Medicare Other

## 2020-06-07 ENCOUNTER — Ambulatory Visit (HOSPITAL_COMMUNITY): Payer: Medicare Other | Admitting: Anesthesiology

## 2020-06-07 ENCOUNTER — Other Ambulatory Visit: Payer: Self-pay

## 2020-06-07 DIAGNOSIS — M109 Gout, unspecified: Secondary | ICD-10-CM | POA: Diagnosis not present

## 2020-06-07 DIAGNOSIS — I10 Essential (primary) hypertension: Secondary | ICD-10-CM | POA: Diagnosis not present

## 2020-06-07 DIAGNOSIS — E785 Hyperlipidemia, unspecified: Secondary | ICD-10-CM | POA: Insufficient documentation

## 2020-06-07 DIAGNOSIS — Z79899 Other long term (current) drug therapy: Secondary | ICD-10-CM | POA: Insufficient documentation

## 2020-06-07 DIAGNOSIS — M5126 Other intervertebral disc displacement, lumbar region: Secondary | ICD-10-CM | POA: Insufficient documentation

## 2020-06-07 DIAGNOSIS — E119 Type 2 diabetes mellitus without complications: Secondary | ICD-10-CM | POA: Diagnosis not present

## 2020-06-07 DIAGNOSIS — M1990 Unspecified osteoarthritis, unspecified site: Secondary | ICD-10-CM | POA: Insufficient documentation

## 2020-06-07 DIAGNOSIS — Z7984 Long term (current) use of oral hypoglycemic drugs: Secondary | ICD-10-CM | POA: Insufficient documentation

## 2020-06-07 DIAGNOSIS — M48062 Spinal stenosis, lumbar region with neurogenic claudication: Secondary | ICD-10-CM | POA: Insufficient documentation

## 2020-06-07 DIAGNOSIS — F17210 Nicotine dependence, cigarettes, uncomplicated: Secondary | ICD-10-CM | POA: Diagnosis not present

## 2020-06-07 DIAGNOSIS — Z7982 Long term (current) use of aspirin: Secondary | ICD-10-CM | POA: Insufficient documentation

## 2020-06-07 DIAGNOSIS — Z87442 Personal history of urinary calculi: Secondary | ICD-10-CM | POA: Insufficient documentation

## 2020-06-07 DIAGNOSIS — Z419 Encounter for procedure for purposes other than remedying health state, unspecified: Secondary | ICD-10-CM

## 2020-06-07 HISTORY — PX: LUMBAR LAMINECTOMY/DECOMPRESSION MICRODISCECTOMY: SHX5026

## 2020-06-07 LAB — GLUCOSE, CAPILLARY
Glucose-Capillary: 92 mg/dL (ref 70–99)
Glucose-Capillary: 112 mg/dL — ABNORMAL HIGH (ref 70–99)

## 2020-06-07 IMAGING — CR DG LUMBAR SPINE 1V
1 series · 1 of 1 positions shown · non-contrast
Comparison: [DATE]

CLINICAL DATA: Intraoperative localization

EXAM:
LUMBAR SPINE - 1 VIEW

[lateral]
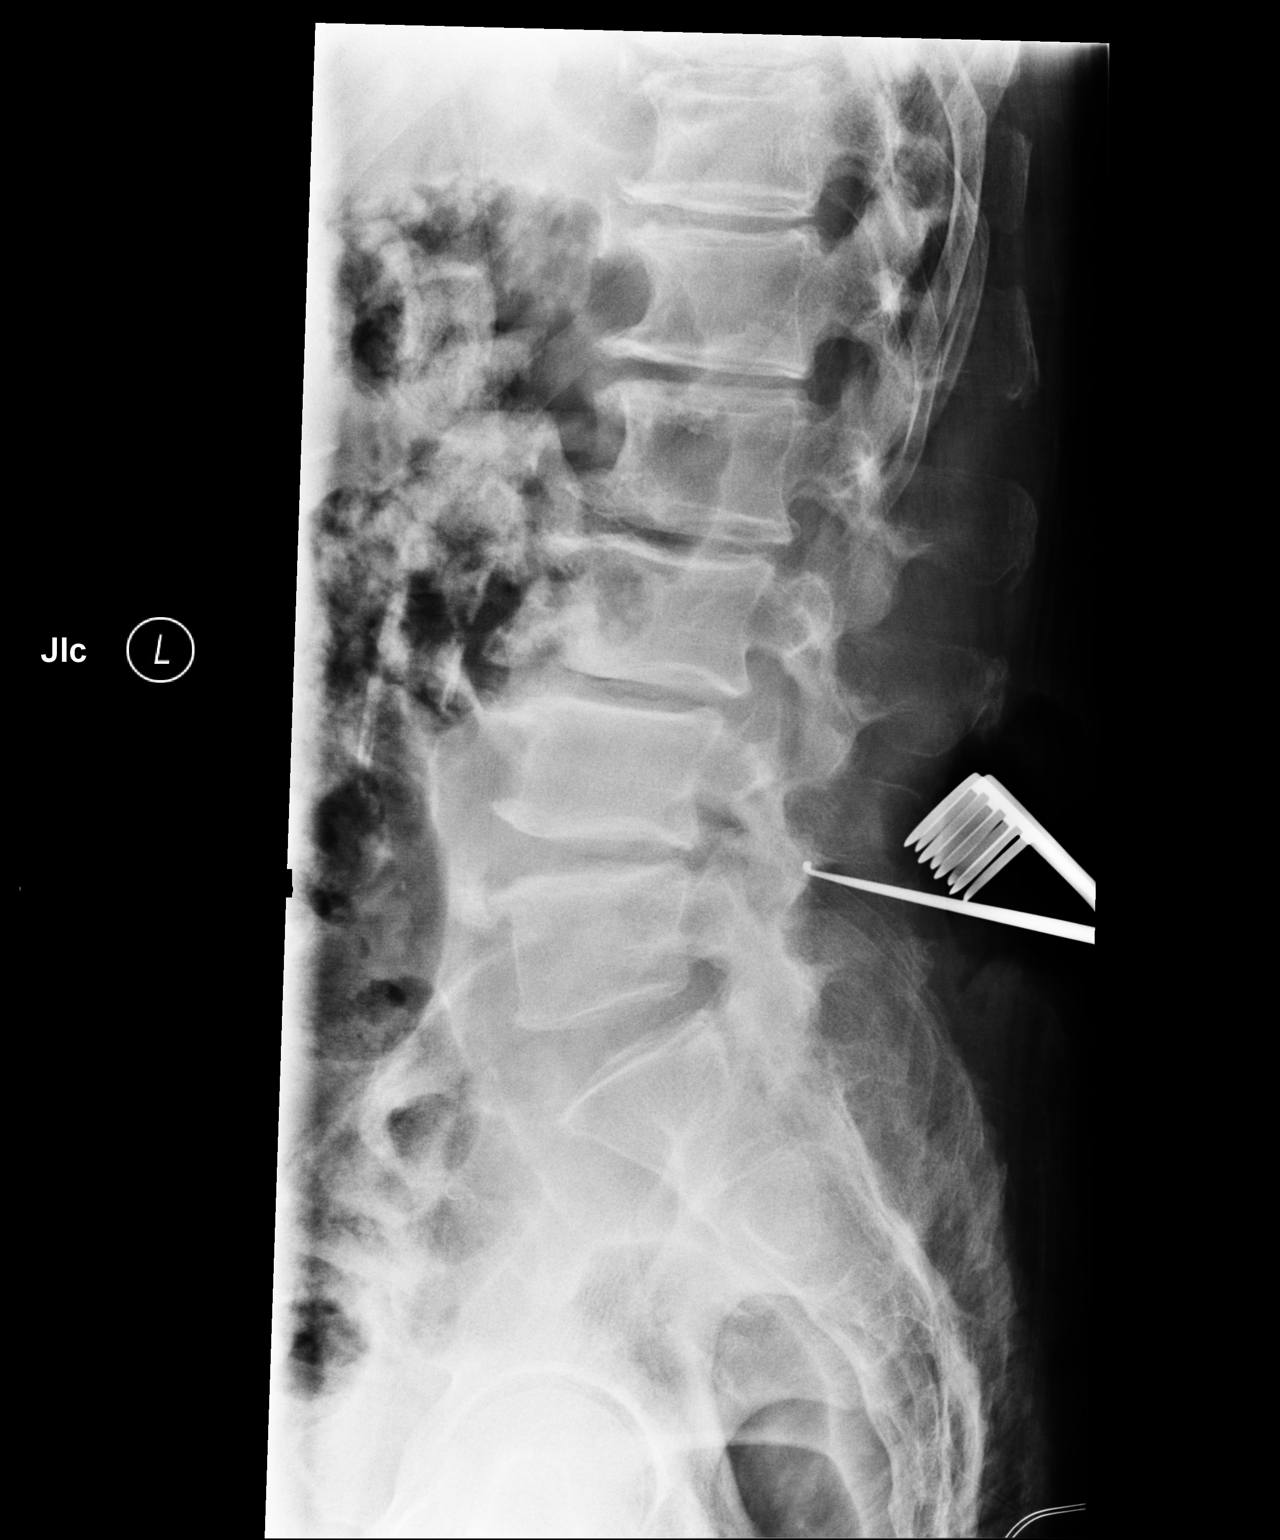

[1 of 1 positions shown; findings below may reference images not displayed]

FINDINGS: Five lumbar type vertebral bodies are well visualized. Vertebral
body height is well maintained. Disc space narrowing is noted
throughout the lumbar spine. Osteophytic changes are seen. Surgical
retractors and instruments are noted at the L4-5 interspace.
IMPRESSION: Intraoperative localization at L4-5.

## 2020-06-07 SURGERY — LUMBAR LAMINECTOMY/DECOMPRESSION MICRODISCECTOMY 2 LEVELS
Anesthesia: General | Site: Spine Lumbar | Laterality: Bilateral

## 2020-06-07 MED ORDER — MIDAZOLAM HCL 2 MG/2ML IJ SOLN
INTRAMUSCULAR | Status: AC
  Filled 2020-06-07: qty 2

## 2020-06-07 MED ORDER — ONDANSETRON HCL 4 MG/2ML IJ SOLN
INTRAMUSCULAR | Status: AC
  Filled 2020-06-07: qty 2

## 2020-06-07 MED ORDER — CELECOXIB 200 MG PO CAPS
ORAL_CAPSULE | ORAL | Status: AC
  Filled 2020-06-07: qty 1

## 2020-06-07 MED ORDER — OXYCODONE HCL 5 MG PO TABS
ORAL_TABLET | ORAL | Status: AC
  Filled 2020-06-07: qty 1

## 2020-06-07 MED ORDER — DEXAMETHASONE SODIUM PHOSPHATE 10 MG/ML IJ SOLN
10.0000 mg | Freq: Once | INTRAMUSCULAR | Status: DC
  Filled 2020-06-07: qty 1

## 2020-06-07 MED ORDER — HEMOSTATIC AGENTS (NO CHARGE) OPTIME
TOPICAL | Status: DC | PRN
  Administered 2020-06-07: 1 via TOPICAL

## 2020-06-07 MED ORDER — ONDANSETRON HCL 4 MG/2ML IJ SOLN
INTRAMUSCULAR | Status: DC | PRN
  Administered 2020-06-07: 4 mg via INTRAVENOUS

## 2020-06-07 MED ORDER — CHLORHEXIDINE GLUCONATE CLOTH 2 % EX PADS: 6.0000 | MEDICATED_PAD | Freq: Once | CUTANEOUS | Status: DC

## 2020-06-07 MED ORDER — FENTANYL CITRATE (PF) 100 MCG/2ML IJ SOLN
INTRAMUSCULAR | Status: AC
  Filled 2020-06-07: qty 2

## 2020-06-07 MED ORDER — MORPHINE SULFATE (PF) 2 MG/ML IV SOLN
2.0000 mg | INTRAVENOUS | Status: DC | PRN
Start: 2020-06-07 — End: 2020-06-07

## 2020-06-07 MED ORDER — LOSARTAN POTASSIUM 50 MG PO TABS: 50.0000 mg | ORAL_TABLET | Freq: Every day | ORAL | Status: DC

## 2020-06-07 MED ORDER — PROPOFOL 10 MG/ML IV BOLUS
INTRAVENOUS | Status: AC
  Filled 2020-06-07: qty 40

## 2020-06-07 MED ORDER — ROCURONIUM BROMIDE 10 MG/ML (PF) SYRINGE
PREFILLED_SYRINGE | INTRAVENOUS | Status: DC | PRN
  Administered 2020-06-07: 50 mg via INTRAVENOUS

## 2020-06-07 MED ORDER — OXYCODONE HCL 5 MG PO TABS
5.0000 mg | ORAL_TABLET | Freq: Once | ORAL | Status: AC | PRN
  Administered 2020-06-07: 5 mg via ORAL

## 2020-06-07 MED ORDER — CELECOXIB 200 MG PO CAPS
200.0000 mg | ORAL_CAPSULE | Freq: Two times a day (BID) | ORAL | Status: DC
  Administered 2020-06-07: 200 mg via ORAL

## 2020-06-07 MED ORDER — THROMBIN 5000 UNITS EX SOLR
CUTANEOUS | Status: DC | PRN
  Administered 2020-06-07: 10000 [IU] via TOPICAL

## 2020-06-07 MED ORDER — 0.9 % SODIUM CHLORIDE (POUR BTL) OPTIME
TOPICAL | Status: DC | PRN
  Administered 2020-06-07: 1000 mL

## 2020-06-07 MED ORDER — THROMBIN 5000 UNITS EX SOLR
CUTANEOUS | Status: AC
  Filled 2020-06-07: qty 10000

## 2020-06-07 MED ORDER — MIDAZOLAM HCL 5 MG/5ML IJ SOLN
INTRAMUSCULAR | Status: DC | PRN
  Administered 2020-06-07: 1 mg via INTRAVENOUS

## 2020-06-07 MED ORDER — ACETAMINOPHEN 10 MG/ML IV SOLN: 1000.0000 mg | Freq: Once | INTRAVENOUS | Status: DC | PRN

## 2020-06-07 MED ORDER — SUGAMMADEX SODIUM 200 MG/2ML IV SOLN
INTRAVENOUS | Status: DC | PRN
  Administered 2020-06-07: 200 mg via INTRAVENOUS

## 2020-06-07 MED ORDER — SUCCINYLCHOLINE CHLORIDE 200 MG/10ML IV SOSY
PREFILLED_SYRINGE | INTRAVENOUS | Status: DC | PRN
  Administered 2020-06-07: 120 mg via INTRAVENOUS

## 2020-06-07 MED ORDER — METHOCARBAMOL 1000 MG/10ML IJ SOLN: 500.0000 mg | Freq: Four times a day (QID) | INTRAVENOUS | Status: DC | PRN

## 2020-06-07 MED ORDER — PROPOFOL 10 MG/ML IV BOLUS
INTRAVENOUS | Status: DC | PRN
  Administered 2020-06-07: 100 mg via INTRAVENOUS

## 2020-06-07 MED ORDER — LIDOCAINE 2% (20 MG/ML) 5 ML SYRINGE
INTRAMUSCULAR | Status: DC | PRN
  Administered 2020-06-07: 60 mg via INTRAVENOUS

## 2020-06-07 MED ORDER — THROMBIN 5000 UNITS EX SOLR
CUTANEOUS | Status: AC
  Filled 2020-06-07: qty 5000

## 2020-06-07 MED ORDER — DEXAMETHASONE SODIUM PHOSPHATE 10 MG/ML IJ SOLN
INTRAMUSCULAR | Status: DC | PRN
  Administered 2020-06-07: 5 mg via INTRAVENOUS

## 2020-06-07 MED ORDER — ACETAMINOPHEN 500 MG PO TABS: 1000.0000 mg | ORAL_TABLET | Freq: Once | ORAL | Status: DC | PRN

## 2020-06-07 MED ORDER — CHLORHEXIDINE GLUCONATE 0.12 % MT SOLN
OROMUCOSAL | Status: AC
  Administered 2020-06-07: 15 mL via OROMUCOSAL
  Filled 2020-06-07: qty 15

## 2020-06-07 MED ORDER — CHLORHEXIDINE GLUCONATE 0.12 % MT SOLN: 15.0000 mL | Freq: Once | OROMUCOSAL | Status: AC

## 2020-06-07 MED ORDER — GLYCOPYRROLATE 0.2 MG/ML IJ SOLN
INTRAMUSCULAR | Status: DC | PRN
  Administered 2020-06-07: .2 mg via INTRAVENOUS

## 2020-06-07 MED ORDER — PHENYLEPHRINE 40 MCG/ML (10ML) SYRINGE FOR IV PUSH (FOR BLOOD PRESSURE SUPPORT)
PREFILLED_SYRINGE | INTRAVENOUS | Status: DC | PRN
  Administered 2020-06-07: 120 ug via INTRAVENOUS

## 2020-06-07 MED ORDER — OXYCODONE HCL 5 MG/5ML PO SOLN: 5.0000 mg | Freq: Once | ORAL | Status: AC | PRN

## 2020-06-07 MED ORDER — ACETAMINOPHEN 10 MG/ML IV SOLN
INTRAVENOUS | Status: DC | PRN
  Administered 2020-06-07: 1000 mg via INTRAVENOUS

## 2020-06-07 MED ORDER — CEFAZOLIN SODIUM-DEXTROSE 2-4 GM/100ML-% IV SOLN
2.0000 g | INTRAVENOUS | Status: AC
  Administered 2020-06-07: 2 g via INTRAVENOUS
  Filled 2020-06-07: qty 100

## 2020-06-07 MED ORDER — HYDROCODONE-ACETAMINOPHEN 7.5-325 MG PO TABS
1.0000 | ORAL_TABLET | Freq: Four times a day (QID) | ORAL | Status: DC
Start: 2020-06-07 — End: 2020-06-07

## 2020-06-07 MED ORDER — FENTANYL CITRATE (PF) 250 MCG/5ML IJ SOLN
INTRAMUSCULAR | Status: AC
  Filled 2020-06-07: qty 5

## 2020-06-07 MED ORDER — ACETAMINOPHEN 10 MG/ML IV SOLN
INTRAVENOUS | Status: AC
  Filled 2020-06-07: qty 100

## 2020-06-07 MED ORDER — AMLODIPINE BESYLATE 10 MG PO TABS: 10.0000 mg | ORAL_TABLET | Freq: Every day | ORAL | Status: DC

## 2020-06-07 MED ORDER — FENTANYL CITRATE (PF) 100 MCG/2ML IJ SOLN
25.0000 ug | INTRAMUSCULAR | Status: DC | PRN
  Administered 2020-06-07: 50 ug via INTRAVENOUS

## 2020-06-07 MED ORDER — ACETAMINOPHEN 160 MG/5ML PO SOLN: 1000.0000 mg | Freq: Once | ORAL | Status: DC | PRN

## 2020-06-07 MED ORDER — BUPIVACAINE HCL (PF) 0.25 % IJ SOLN
INTRAMUSCULAR | Status: AC
  Filled 2020-06-07: qty 30

## 2020-06-07 MED ORDER — ONDANSETRON HCL 4 MG PO TABS: 4.0000 mg | ORAL_TABLET | Freq: Four times a day (QID) | ORAL | Status: DC | PRN

## 2020-06-07 MED ORDER — THROMBIN 5000 UNITS EX SOLR: OROMUCOSAL | Status: DC | PRN

## 2020-06-07 MED ORDER — LACTATED RINGERS IV SOLN: INTRAVENOUS | Status: DC

## 2020-06-07 MED ORDER — METHOCARBAMOL 500 MG PO TABS
ORAL_TABLET | ORAL | Status: AC
  Filled 2020-06-07: qty 1

## 2020-06-07 MED ORDER — PHENYLEPHRINE HCL-NACL 10-0.9 MG/250ML-% IV SOLN
INTRAVENOUS | Status: DC | PRN
  Administered 2020-06-07: 40 ug/min via INTRAVENOUS

## 2020-06-07 MED ORDER — FENTANYL CITRATE (PF) 100 MCG/2ML IJ SOLN
INTRAMUSCULAR | Status: DC | PRN
  Administered 2020-06-07: 50 ug via INTRAVENOUS
  Administered 2020-06-07: 25 ug via INTRAVENOUS
  Administered 2020-06-07: 50 ug via INTRAVENOUS
  Administered 2020-06-07: 25 ug via INTRAVENOUS

## 2020-06-07 MED ORDER — METHOCARBAMOL 500 MG PO TABS
500.0000 mg | ORAL_TABLET | Freq: Four times a day (QID) | ORAL | Status: DC | PRN
  Administered 2020-06-07: 500 mg via ORAL

## 2020-06-07 MED ORDER — ORAL CARE MOUTH RINSE: 15.0000 mL | Freq: Once | OROMUCOSAL | Status: AC

## 2020-06-07 MED ORDER — BUPIVACAINE HCL (PF) 0.25 % IJ SOLN
INTRAMUSCULAR | Status: DC | PRN
  Administered 2020-06-07: 3 mL

## 2020-06-07 MED ORDER — ONDANSETRON HCL 4 MG/2ML IJ SOLN
4.0000 mg | Freq: Four times a day (QID) | INTRAMUSCULAR | Status: DC | PRN
Start: 2020-06-07 — End: 2020-06-07
  Administered 2020-06-07: 4 mg via INTRAVENOUS

## 2020-06-07 MED ORDER — SODIUM CHLORIDE 0.9 % IV SOLN: INTRAVENOUS | Status: DC | PRN

## 2020-06-07 SURGICAL SUPPLY — 51 items
ADH SKN CLS APL DERMABOND .7 (GAUZE/BANDAGES/DRESSINGS) ×1
APL SKNCLS STERI-STRIP NONHPOA (GAUZE/BANDAGES/DRESSINGS) ×1
BAG DECANTER FOR FLEXI CONT (MISCELLANEOUS) ×3 IMPLANT
BAND INSRT 18 STRL LF DISP RB (MISCELLANEOUS) ×2
BAND RUBBER #18 3X1/16 STRL (MISCELLANEOUS) ×6 IMPLANT
BENZOIN TINCTURE PRP APPL 2/3 (GAUZE/BANDAGES/DRESSINGS) ×3 IMPLANT
BUR CARBIDE MATCH 3.0 (BURR) ×3 IMPLANT
CANISTER SUCT 3000ML PPV (MISCELLANEOUS) ×3 IMPLANT
CARTRIDGE OIL MAESTRO DRILL (MISCELLANEOUS) ×1 IMPLANT
CLOSURE WOUND 1/2 X4 (GAUZE/BANDAGES/DRESSINGS) ×1
COVER WAND RF STERILE (DRAPES) ×3 IMPLANT
DERMABOND ADVANCED (GAUZE/BANDAGES/DRESSINGS) ×2
DERMABOND ADVANCED .7 DNX12 (GAUZE/BANDAGES/DRESSINGS) IMPLANT
DIFFUSER DRILL AIR PNEUMATIC (MISCELLANEOUS) ×3 IMPLANT
DRAPE LAPAROTOMY 100X72X124 (DRAPES) ×3 IMPLANT
DRAPE MICROSCOPE LEICA (MISCELLANEOUS) ×3 IMPLANT
DRAPE SURG 17X23 STRL (DRAPES) ×3 IMPLANT
DRSG OPSITE POSTOP 4X6 (GAUZE/BANDAGES/DRESSINGS) ×2 IMPLANT
DURAPREP 26ML APPLICATOR (WOUND CARE) ×3 IMPLANT
ELECT REM PT RETURN 9FT ADLT (ELECTROSURGICAL) ×3
ELECTRODE REM PT RTRN 9FT ADLT (ELECTROSURGICAL) ×1 IMPLANT
GAUZE 4X4 16PLY RFD (DISPOSABLE) IMPLANT
GLOVE BIO SURGEON STRL SZ7 (GLOVE) IMPLANT
GLOVE BIO SURGEON STRL SZ8 (GLOVE) ×3 IMPLANT
GLOVE BIOGEL PI IND STRL 7.0 (GLOVE) IMPLANT
GLOVE BIOGEL PI INDICATOR 7.0 (GLOVE)
GOWN STRL REUS W/ TWL LRG LVL3 (GOWN DISPOSABLE) IMPLANT
GOWN STRL REUS W/ TWL XL LVL3 (GOWN DISPOSABLE) ×1 IMPLANT
GOWN STRL REUS W/TWL 2XL LVL3 (GOWN DISPOSABLE) IMPLANT
GOWN STRL REUS W/TWL LRG LVL3 (GOWN DISPOSABLE)
GOWN STRL REUS W/TWL XL LVL3 (GOWN DISPOSABLE) ×3
HEMOSTAT POWDER KIT SURGIFOAM (HEMOSTASIS) IMPLANT
KIT BASIN OR (CUSTOM PROCEDURE TRAY) ×3 IMPLANT
KIT TURNOVER KIT B (KITS) ×3 IMPLANT
NDL HYPO 25X1 1.5 SAFETY (NEEDLE) ×1 IMPLANT
NDL SPNL 20GX3.5 QUINCKE YW (NEEDLE) IMPLANT
NEEDLE HYPO 25X1 1.5 SAFETY (NEEDLE) ×3 IMPLANT
NEEDLE SPNL 20GX3.5 QUINCKE YW (NEEDLE) IMPLANT
NS IRRIG 1000ML POUR BTL (IV SOLUTION) ×3 IMPLANT
OIL CARTRIDGE MAESTRO DRILL (MISCELLANEOUS) ×3
PACK LAMINECTOMY NEURO (CUSTOM PROCEDURE TRAY) ×3 IMPLANT
PAD ARMBOARD 7.5X6 YLW CONV (MISCELLANEOUS) ×9 IMPLANT
SPONGE SURGIFOAM ABS GEL SZ50 (HEMOSTASIS) IMPLANT
STRIP CLOSURE SKIN 1/2X4 (GAUZE/BANDAGES/DRESSINGS) ×2 IMPLANT
SUT VIC AB 0 CT1 18XCR BRD8 (SUTURE) ×1 IMPLANT
SUT VIC AB 0 CT1 8-18 (SUTURE) ×3
SUT VIC AB 2-0 CP2 18 (SUTURE) ×3 IMPLANT
SUT VIC AB 3-0 SH 8-18 (SUTURE) ×3 IMPLANT
TOWEL GREEN STERILE (TOWEL DISPOSABLE) ×3 IMPLANT
TOWEL GREEN STERILE FF (TOWEL DISPOSABLE) ×3 IMPLANT
WATER STERILE IRR 1000ML POUR (IV SOLUTION) ×3 IMPLANT

## 2020-06-07 NOTE — Anesthesia Procedure Notes (Addendum)
Procedure Name: Intubation Date/Time: 06/07/2020 7:49 AM Performed by: Elliot Dally, CRNA Pre-anesthesia Checklist: Patient identified, Emergency Drugs available, Suction available and Patient being monitored Patient Re-evaluated:Patient Re-evaluated prior to induction Oxygen Delivery Method: Circle System Utilized Preoxygenation: Pre-oxygenation with 100% oxygen Induction Type: IV induction Laryngoscope Size: Glidescope and 4 Grade View: Grade I Tube type: Oral Tube size: 7.5 mm Number of attempts: 1 Airway Equipment and Method: Stylet and Oral airway Placement Confirmation: ETT inserted through vocal cords under direct vision,  positive ETCO2 and breath sounds checked- equal and bilateral Secured at: 23 cm Tube secured with: Tape Dental Injury: Teeth and Oropharynx as per pre-operative assessment  Difficulty Due To: Difficulty was anticipated and Difficult Airway- due to reduced neck mobility Comments: Hx of difficult intubation. Easy glide.

## 2020-06-07 NOTE — Transfer of Care (Signed)
Immediate Anesthesia Transfer of Care Note  Patient: Timothy Hester  Procedure(s) Performed: Lumbar Three-Four, Lumbar Four-Five Laminectomy (Bilateral Spine Lumbar)  Patient Location: PACU  Anesthesia Type:General  Level of Consciousness: awake and alert   Airway & Oxygen Therapy: Patient Spontanous Breathing and Patient connected to nasal cannula oxygen  Post-op Assessment: Report given to RN and Post -op Vital signs reviewed and stable  Post vital signs: Reviewed and stable  Last Vitals:  Vitals Value Taken Time  BP 136/67 06/07/20 0952  Temp    Pulse 66 06/07/20 0955  Resp 9 06/07/20 0955  SpO2 100 % 06/07/20 0955  Vitals shown include unvalidated device data.  Last Pain:  Vitals:   06/07/20 0609  TempSrc: Oral  PainSc: 3       Patients Stated Pain Goal: 4 (06/07/20 5974)  Complications: No complications documented.
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
## 2020-06-07 NOTE — Op Note (Signed)
06/07/2020  9:40 AM  PATIENT:  Timothy Hester  68 y.o. 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  BLOOD ADMINISTERED: none  DRAINS: None  SPECIMEN:  none  INDICATION FOR PROCEDURE: This patient presented with neurogenic claudication. Imaging showed severe stenosis L3-4 and L4-5 with disc herniation L3-4 on the left. The patient tried conservative measures without relief. Pain was debilitating. Recommended decompressive laminectomy. Patient understood the risks, benefits, and alternatives and potential outcomes and wished to proceed.  PROCEDURE DETAILS: The patient was taken to the operating room and after induction of adequate generalized endotracheal anesthesia, the patient was rolled into the prone position on the Wilson frame and all pressure points were padded. The lumbar region was cleaned and then prepped with DuraPrep and draped in the usual sterile fashion. 5 cc of local anesthesia was injected and then a dorsal midline incision was made and carried down to the lumbo sacral fascia. The fascia was opened and the paraspinous musculature was taken down in a subperiosteal fashion to expose L3-4 and L4-5 bilaterally. Intraoperative x-ray confirmed my level, and then I remove the L3 and L4 spinous process and used a combination of the high-speed drill and the Kerrison punches to perform a hemilaminectomy, medial facetectomy, and foraminotomy at L3-4 and L4-5 bilaterally. The underlying yellow ligament was opened and removed in a piecemeal fashion to expose the underlying dura and exiting nerve root bilaterally at each level. I undercut the  lateral recess and dissected down until I was medial to and distal to the pedicle of L4 and L5. The nerve root was well decompressed. We then gently retracted the nerve root medially with a retractor, coagulated the epidural venous vasculature, and inspected the disc space.  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  This was removed.  I found a significant disc herniation L3-4 on the left causing compression of the left L4 nerve root.  I performed a small annulotomy and performed a thorough intradiscal discectomy with pituitary rongeurs and curettes, until I had a nice decompression of the nerve root and the midline. I then palpated with a coronary dilator along the nerve root and into the foramen to assure adequate decompression. I felt no more compression of the nerve root. I irrigated with saline solution containing bacitracin. Achieved hemostasis with bipolar cautery, lined the dura with Gelfoam, and then closed the fascia with 0 Vicryl. I closed the subcutaneous tissues with 2-0 Vicryl and the subcuticular tissues with 3-0 Vicryl. The skin was then closed with benzoin and Steri-Strips. The drapes were removed, a sterile dressing was applied.   the patient was awakened from general anesthesia and transferred to the recovery room in stable condition. At the end of the procedure all sponge, needle and instrument counts were correct.    PLAN OF CARE: Discharge to home after PACU  PATIENT DISPOSITION:  PACU - hemodynamically stable.   Delay start of Pharmacological VTE agent (>24hrs) due to surgical blood loss or risk of bleeding:  yes

## 2020-06-08 NOTE — Anesthesia Postprocedure Evaluation (Signed)
Anesthesia Post Note  Patient: Timothy Hester  Procedure(s) Performed: Lumbar Three-Four, Lumbar Four-Five Laminectomy (Bilateral Spine Lumbar)     Patient location during evaluation: PACU Anesthesia Type: General Level of consciousness: awake and alert Pain management: pain level controlled Vital Signs Assessment: post-procedure vital signs reviewed and stable Respiratory status: spontaneous breathing, nonlabored ventilation, respiratory function stable and patient connected to nasal cannula oxygen Cardiovascular status: blood pressure returned to baseline and stable Postop Assessment: no apparent nausea or vomiting Anesthetic complications: no   No complications documented.  Last Vitals:  Vitals:   06/07/20 1100 06/07/20 1115  BP: 116/80 (!) 137/98  Pulse: 70 71  Resp: 13 16  Temp:    SpO2: 94% 95%    Last Pain:  Vitals:   06/07/20 1037  TempSrc:   PainSc: Asleep                 [name]

## 2020-06-09 ENCOUNTER — Encounter (HOSPITAL_COMMUNITY): Payer: Self-pay | Admitting: Neurological Surgery

## 2020-06-11 ENCOUNTER — Telehealth: Payer: Self-pay | Admitting: *Deleted

## 2020-06-11 NOTE — Telephone Encounter (Signed)
Patient had back surgery friday and has not had a BM yet and needs to go.  He had increased doses of metamucil yesterday but no results and has not tried anything else.  Patient's niece Angelica Chessman) discussed prune juice and or MOM and to warm it (that was one of her super powers in working at the nursing home), but didn't want to recommend something without Dr. Lianne Bushy approval.  His incision is lower back and he has a lot of pain.  A hard, straining BM could be very problematic at this point.  Any Advice?

## 2020-06-11 NOTE — Telephone Encounter (Signed)
Mandy advised and will relay message to wife.

## 2020-06-11 NOTE — Telephone Encounter (Addendum)
Prune juice or MOM or miralax are good options.  He'll likely need some ongoing or prn use given his oxycodone need/use for pain.  Let me know if oxycodone isn't keeping his pain in check.  Thanks.

## 2020-06-12 ENCOUNTER — Other Ambulatory Visit: Payer: Self-pay | Admitting: Family Medicine

## 2020-06-12 MED ORDER — ONDANSETRON HCL 4 MG PO TABS: 4.0000 mg | ORAL_TABLET | Freq: Three times a day (TID) | ORAL | 1 refills | Status: DC | PRN

## 2020-06-12 NOTE — Progress Notes (Signed)
Call from family, patient needed med for nausea.  rx sent.  Discussed bowel regimen and pain control.  Family can update me as needed.

## 2020-06-18 ENCOUNTER — Other Ambulatory Visit: Payer: Self-pay

## 2020-06-18 ENCOUNTER — Ambulatory Visit (INDEPENDENT_AMBULATORY_CARE_PROVIDER_SITE_OTHER): Payer: Medicare Other | Admitting: Family Medicine

## 2020-06-18 ENCOUNTER — Encounter: Payer: Self-pay | Admitting: Family Medicine

## 2020-06-18 VITALS — BP 122/84 | HR 77 | Temp 97.1°F | Ht 68.0 in | Wt 215.2 lb

## 2020-06-18 DIAGNOSIS — M545 Low back pain, unspecified: Secondary | ICD-10-CM

## 2020-06-18 DIAGNOSIS — E785 Hyperlipidemia, unspecified: Secondary | ICD-10-CM

## 2020-06-18 DIAGNOSIS — I639 Cerebral infarction, unspecified: Secondary | ICD-10-CM | POA: Diagnosis not present

## 2020-06-18 DIAGNOSIS — I10 Essential (primary) hypertension: Secondary | ICD-10-CM | POA: Diagnosis not present

## 2020-06-18 LAB — CBC WITH DIFFERENTIAL/PLATELET
Basophils Absolute: 0.1 10*3/uL (ref 0.0–0.1)
Basophils Relative: 0.9 % (ref 0.0–3.0)
Eosinophils Absolute: 0.4 10*3/uL (ref 0.0–0.7)
Eosinophils Relative: 3.1 % (ref 0.0–5.0)
HCT: 37.4 % — ABNORMAL LOW (ref 39.0–52.0)
Hemoglobin: 12.5 g/dL — ABNORMAL LOW (ref 13.0–17.0)
Lymphocytes Relative: 15.7 % (ref 12.0–46.0)
Lymphs Abs: 1.8 10*3/uL (ref 0.7–4.0)
MCHC: 33.4 g/dL (ref 30.0–36.0)
MCV: 100 fl (ref 78.0–100.0)
Monocytes Absolute: 0.9 10*3/uL (ref 0.1–1.0)
Monocytes Relative: 8.3 % (ref 3.0–12.0)
Neutro Abs: 8.2 10*3/uL — ABNORMAL HIGH (ref 1.4–7.7)
Neutrophils Relative %: 72 % (ref 43.0–77.0)
Platelets: 260 10*3/uL (ref 150.0–400.0)
RBC: 3.74 Mil/uL — ABNORMAL LOW (ref 4.22–5.81)
RDW: 14.3 % (ref 11.5–15.5)
WBC: 11.4 10*3/uL — ABNORMAL HIGH (ref 4.0–10.5)

## 2020-06-18 LAB — COMPREHENSIVE METABOLIC PANEL
ALT: 13 U/L (ref 0–53)
AST: 17 U/L (ref 0–37)
Albumin: 4.4 g/dL (ref 3.5–5.2)
Alkaline Phosphatase: 49 U/L (ref 39–117)
BUN: 14 mg/dL (ref 6–23)
CO2: 33 mEq/L — ABNORMAL HIGH (ref 19–32)
Calcium: 9.1 mg/dL (ref 8.4–10.5)
Chloride: 96 mEq/L (ref 96–112)
Creatinine, Ser: 1.34 mg/dL (ref 0.40–1.50)
GFR: 52.75 mL/min — ABNORMAL LOW (ref >60.00)
Glucose, Bld: 91 mg/dL (ref 70–99)
Potassium: 4.4 mEq/L (ref 3.5–5.1)
Sodium: 134 mEq/L — ABNORMAL LOW (ref 135–145)
Total Bilirubin: 0.6 mg/dL (ref 0.2–1.2)
Total Protein: 7.6 g/dL (ref 6.0–8.3)

## 2020-06-18 MED ORDER — MELOXICAM 7.5 MG PO TABS: 7.5000 mg | ORAL_TABLET | Freq: Two times a day (BID) | ORAL | 1 refills | Status: DC | PRN

## 2020-06-18 MED ORDER — AMLODIPINE BESYLATE 10 MG PO TABS: 10.0000 mg | ORAL_TABLET | Freq: Every day | ORAL | 1 refills | Status: DC

## 2020-06-18 NOTE — Patient Instructions (Addendum)
Please ask the surgery clinic about PT.   Take meloxicam if needed with food.  Go to the lab on the way out.   If you have mychart we'll likely use that to update you.    Take care.  Glad to see you.

## 2020-06-18 NOTE — Progress Notes (Signed)
This visit occurred during the SARS-CoV-2 public health emergency.  Safety protocols were in place, including screening questions prior to the visit, additional usage of staff PPE, and extensive cleaning of exam room while observing appropriate contact time as indicated for disinfecting solutions.  S/p back surgery with post op constipation (d/w pt about options for bowel regimen). Inpatient course and recent events discussed with patient taking less oxycodone, off that medication recently.  Still with pain standing, but less pain sitting unless he shifts his weight. Pain is in the buttocks B.  Not sleeping well from pain.  Off pain meds, off meloxicam.    Normal BMs now.  No FCNAVD. No new neurologic symptoms. Appetite improved. Tolerating p.o. Not yet in physical therapy. Compliant with blood pressure medications. No chest pain.  Meds, vitals, and allergies reviewed.   ROS: Per HPI unless specifically indicated in ROS section   GEN: nad, alert and oriented HEENT: ncat NECK: supple w/o LA CV: rrr.  PULM: ctab, no inc wob ABD: soft, +bs EXT: no edema SKIN: no acute rash, lower back with well-healed incision without spreading erythema. Able to stand.

## 2020-06-21 NOTE — Assessment & Plan Note (Signed)
Continue amlodipine 10 mg a day along with losartan 50 mg. Recheck routine labs today. Rationale for meds discussed with patient. NSAID cautions discussed with patient, but it would be okay to use meloxicam in the meantime assuming it provides some relief and his creatinine is reasonable.

## 2020-06-21 NOTE — Assessment & Plan Note (Addendum)
With recent lower back surgery noted, with postop course complicated by constipation. Discussed with patient about options. He is going to follow-up with neurosurgery. I asked him to check on physical therapy when he goes to the neurosurgery appointment. Reasonable to try meloxicam with routine GI/NSAID cautions. Recheck routine labs today. If pain is not well controlled and he will update me in the meantime. Discussed bowel regimen to try to limit constipation. Okay for outpatient follow-up.

## 2020-06-27 ENCOUNTER — Telehealth: Payer: Self-pay

## 2020-06-27 NOTE — Telephone Encounter (Signed)
Reasonable to use OTC compression stockings and elevate. Let me know if the stockings don't help.    I realize the lasix didn't help with swelling but die he notice inc in UOP with lasix use?    We may need to taper amlodipine as that could contribute to BLE edema.  If BP is <140/<90, would be okay to cut amlodipine in half for a few days.    Thanks.

## 2020-06-27 NOTE — Telephone Encounter (Signed)
Mandy RN at [facility] sent me this team message to fwd to Dr Para March.  [11:45 AM] Syliva Overman     symptoms:  patient has significant swelling to B legs from knees down to feet.  Fluid is visible under skin, looks like you could prick it with a needle.  Neurosurgeon put him on Furosemide last week X 2 days which did not help.  Family denies that he has any accumulating fluid around abdomen or upper extremities.  No SOB or dyspnea.  He has been very sedentary since surgery and even was using a wheelchair last week to appointments.  neurosurgeon very unhappy with his progress after surgery, expected him to be much further along.  However, patient still has not started physical therapy! he doesn't have appt with them until later this month.  Patient is not showing desire to want to ambulate as instructed.  He denies that this is pain related.  He also has ongoing constipation.  Family is giving him Dulocolax and niece, Angelica Chessman instructed them to be sure they are giving him the Miralax every day for ongoing treatment.  he has not been taking it every day.     ?[11:46 AM] Syliva Overman     What recommendations does Dr. Para March have for patient at this point.  Concern for significant swelling.  Should family purchase compression socks?  they are elevating his feet at rest. Okay to contact Daughter Aram Beecham with information.

## 2020-06-27 NOTE — Telephone Encounter (Addendum)
No answer to daughter's phone and mail box is full.  Message given to [facility].  Angelica Chessman says    I did ask him (i forgot to put in the initial message) about increased UOP with lasix.  he denied noticing any difference.  I will have them work on cutting the amlodipine if bp permits for next few days and follow up on Monday with Para March.

## 2020-10-18 ENCOUNTER — Other Ambulatory Visit: Payer: Self-pay

## 2020-10-18 ENCOUNTER — Emergency Department (HOSPITAL_COMMUNITY): Payer: Medicare Other

## 2020-10-18 ENCOUNTER — Observation Stay (HOSPITAL_COMMUNITY): Payer: Medicare Other

## 2020-10-18 ENCOUNTER — Observation Stay (HOSPITAL_COMMUNITY)
Admission: EM | Admit: 2020-10-18 | Discharge: 2020-10-20 | Disposition: A | Discharge status: Home / Self Care | Payer: Medicare Other | Attending: Internal Medicine | Admitting: Internal Medicine

## 2020-10-18 ENCOUNTER — Encounter (HOSPITAL_COMMUNITY): Payer: Self-pay | Admitting: Emergency Medicine

## 2020-10-18 DIAGNOSIS — R7303 Prediabetes: Secondary | ICD-10-CM | POA: Diagnosis not present

## 2020-10-18 DIAGNOSIS — Z20822 Contact with and (suspected) exposure to covid-19: Secondary | ICD-10-CM | POA: Insufficient documentation

## 2020-10-18 DIAGNOSIS — Z7982 Long term (current) use of aspirin: Secondary | ICD-10-CM | POA: Insufficient documentation

## 2020-10-18 DIAGNOSIS — H538 Other visual disturbances: Secondary | ICD-10-CM | POA: Diagnosis present

## 2020-10-18 DIAGNOSIS — I10 Essential (primary) hypertension: Secondary | ICD-10-CM | POA: Diagnosis not present

## 2020-10-18 DIAGNOSIS — R7989 Other specified abnormal findings of blood chemistry: Secondary | ICD-10-CM

## 2020-10-18 DIAGNOSIS — Z79899 Other long term (current) drug therapy: Secondary | ICD-10-CM | POA: Diagnosis not present

## 2020-10-18 DIAGNOSIS — F17210 Nicotine dependence, cigarettes, uncomplicated: Secondary | ICD-10-CM | POA: Diagnosis not present

## 2020-10-18 DIAGNOSIS — R2243 Localized swelling, mass and lump, lower limb, bilateral: Secondary | ICD-10-CM | POA: Diagnosis not present

## 2020-10-18 DIAGNOSIS — G459 Transient cerebral ischemic attack, unspecified: Secondary | ICD-10-CM | POA: Diagnosis not present

## 2020-10-18 DIAGNOSIS — Z7984 Long term (current) use of oral hypoglycemic drugs: Secondary | ICD-10-CM | POA: Insufficient documentation

## 2020-10-18 DIAGNOSIS — E039 Hypothyroidism, unspecified: Secondary | ICD-10-CM

## 2020-10-18 DIAGNOSIS — R609 Edema, unspecified: Secondary | ICD-10-CM

## 2020-10-18 LAB — CBC WITH DIFFERENTIAL/PLATELET
Abs Immature Granulocytes: 0.03 10*3/uL (ref 0.00–0.07)
Basophils Absolute: 0.1 10*3/uL (ref 0.0–0.1)
Basophils Relative: 1 %
Eosinophils Absolute: 0.3 10*3/uL (ref 0.0–0.5)
Eosinophils Relative: 5 %
HCT: 43.8 % (ref 39.0–52.0)
Hemoglobin: 14.5 g/dL (ref 13.0–17.0)
Immature Granulocytes: 0 %
Lymphocytes Relative: 24 %
Lymphs Abs: 1.6 10*3/uL (ref 0.7–4.0)
MCH: 32.6 pg (ref 26.0–34.0)
MCHC: 33.1 g/dL (ref 30.0–36.0)
MCV: 98.4 fL (ref 80.0–100.0)
Monocytes Absolute: 0.7 10*3/uL (ref 0.1–1.0)
Monocytes Relative: 10 %
Neutro Abs: 4.1 10*3/uL (ref 1.7–7.7)
Neutrophils Relative %: 60 %
Platelets: 206 10*3/uL (ref 150–400)
RBC: 4.45 MIL/uL (ref 4.22–5.81)
RDW: 14.2 % (ref 11.5–15.5)
WBC: 6.9 10*3/uL (ref 4.0–10.5)
nRBC: 0 % (ref 0.0–0.2)

## 2020-10-18 LAB — URINALYSIS, ROUTINE W REFLEX MICROSCOPIC
Bilirubin Urine: NEGATIVE
Glucose, UA: NEGATIVE mg/dL
Hgb urine dipstick: NEGATIVE
Ketones, ur: NEGATIVE mg/dL
Leukocytes,Ua: NEGATIVE
Nitrite: NEGATIVE
Protein, ur: NEGATIVE mg/dL
Specific Gravity, Urine: 1.01 (ref 1.005–1.030)
pH: 7 (ref 5.0–8.0)

## 2020-10-18 LAB — COMPREHENSIVE METABOLIC PANEL
ALT: 15 U/L (ref 0–44)
AST: 25 U/L (ref 15–41)
Albumin: 4.9 g/dL (ref 3.5–5.0)
Alkaline Phosphatase: 38 U/L (ref 38–126)
Anion gap: 9 (ref 5–15)
BUN: 16 mg/dL (ref 8–23)
CO2: 29 mmol/L (ref 22–32)
Calcium: 9 mg/dL (ref 8.9–10.3)
Chloride: 91 mmol/L — ABNORMAL LOW (ref 98–111)
Creatinine, Ser: 1.39 mg/dL — ABNORMAL HIGH (ref 0.61–1.24)
GFR, Estimated: 55 mL/min — ABNORMAL LOW (ref >60)
Glucose, Bld: 87 mg/dL (ref 70–99)
Potassium: 4 mmol/L (ref 3.5–5.1)
Sodium: 129 mmol/L — ABNORMAL LOW (ref 135–145)
Total Bilirubin: 0.6 mg/dL (ref 0.3–1.2)
Total Protein: 8.4 g/dL — ABNORMAL HIGH (ref 6.5–8.1)

## 2020-10-18 LAB — TROPONIN I (HIGH SENSITIVITY)
Troponin I (High Sensitivity): 4 ng/L (ref <18)
Troponin I (High Sensitivity): 5 ng/L (ref <18)

## 2020-10-18 LAB — SODIUM, URINE, RANDOM: Sodium, Ur: 99 mmol/L

## 2020-10-18 LAB — CBG MONITORING, ED: Glucose-Capillary: 110 mg/dL — ABNORMAL HIGH (ref 70–99)

## 2020-10-18 LAB — CREATININE, URINE, RANDOM: Creatinine, Urine: 28.84 mg/dL

## 2020-10-18 LAB — RESP PANEL BY RT-PCR (FLU A&B, COVID) ARPGX2
Influenza A by PCR: NEGATIVE
Influenza B by PCR: NEGATIVE
SARS Coronavirus 2 by RT PCR: NEGATIVE

## 2020-10-18 LAB — PROTIME-INR
INR: 1 (ref 0.8–1.2)
Prothrombin Time: 12.5 seconds (ref 11.4–15.2)

## 2020-10-18 LAB — ETHANOL: Alcohol, Ethyl (B): <10 mg/dL (ref <10)

## 2020-10-18 IMAGING — DX DG CHEST 2V
2 series · 2 of 2 positions shown · non-contrast
Comparison: [DATE]

CLINICAL DATA: Left-sided weakness, left-sided facial droop

EXAM:
CHEST - 2 VIEW

[chest pa]
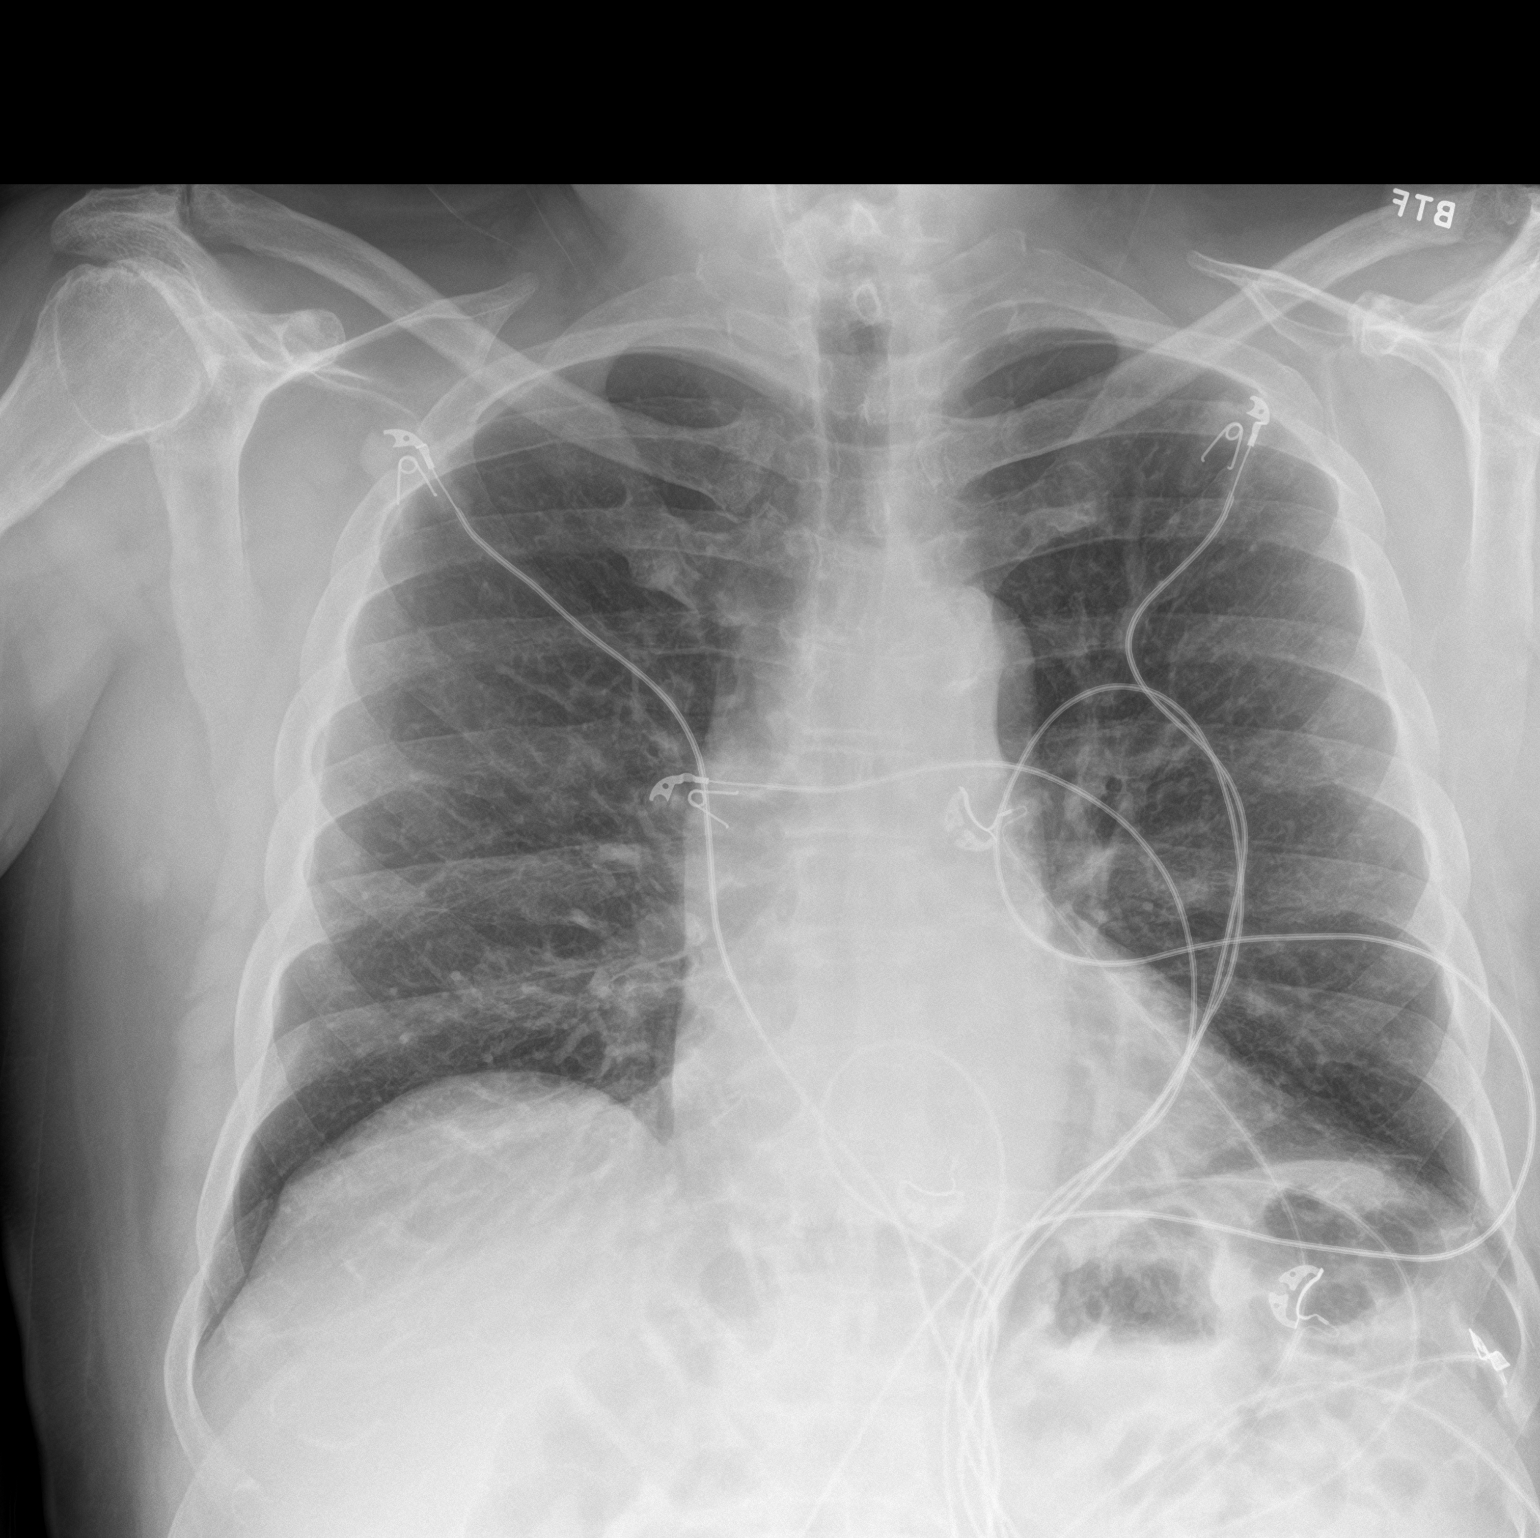

[chest lat]
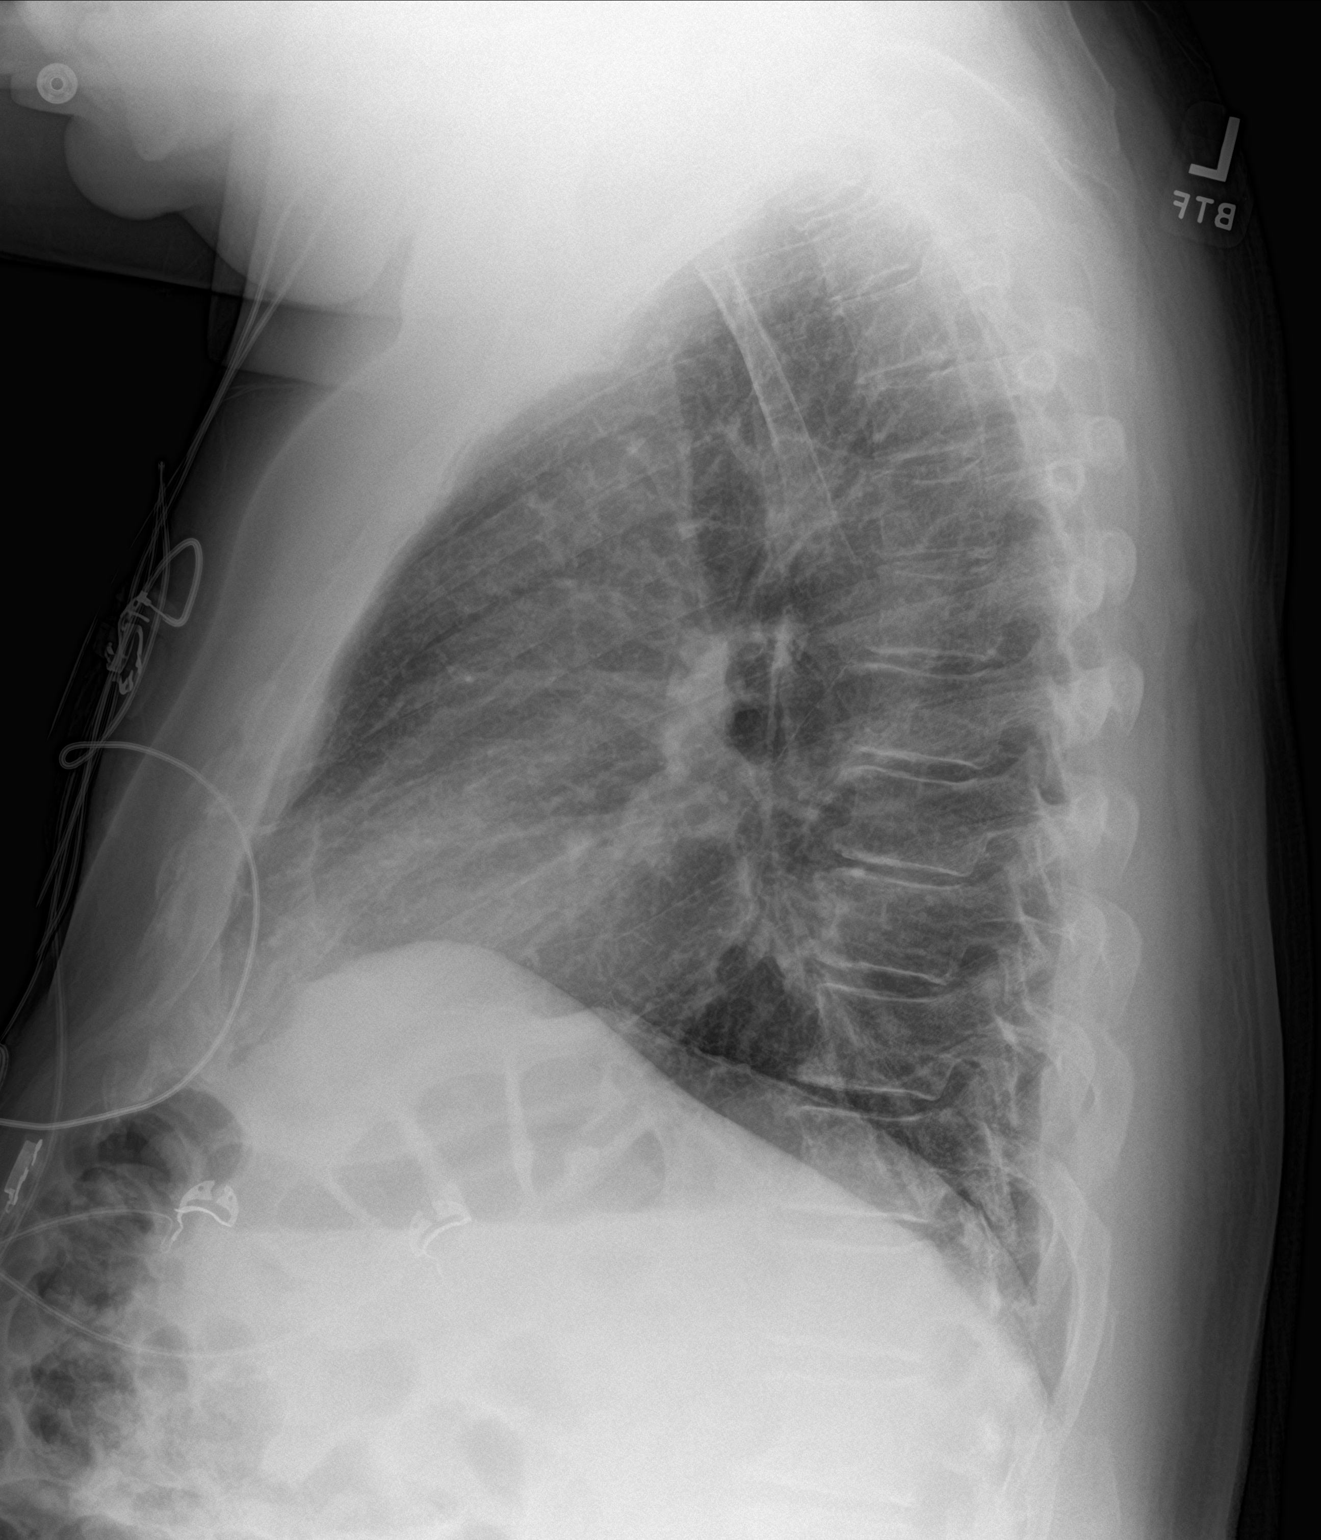

[2 of 2 positions shown; findings below may reference images not displayed]

FINDINGS: The heart size and mediastinal contours are within normal limits.
Both lungs are clear. The visualized skeletal structures are
unremarkable.
IMPRESSION: No active cardiopulmonary disease.

## 2020-10-18 IMAGING — CT CT ANGIO NECK
3 of 7 series · 9 of 35 positions shown · IV contrast (Omnipaque or Isovue)
Comparison: Head MRA [DATE]. Carotid Doppler ultrasound
[DATE].

CLINICAL DATA: Left-sided weakness, numbness, and facial droop.
Diplopia.

EXAM:
CT ANGIOGRAPHY HEAD AND NECK
TECHNIQUE: Multidetector CT imaging of the head and neck was performed using
the standard protocol during bolus administration of intravenous
contrast. Multiplanar CT image reconstructions and MIPs were
obtained to evaluate the vascular anatomy. Carotid stenosis
measurements (when applicable) are obtained utilizing NASCET
criteria, using the distal internal carotid diameter as the
denominator.
CONTRAST:  75mL OMNIPAQUE IOHEXOL 350 MG/ML SOLN

[Series 4: cta head & neck · axial · 0.73mm/px · z∈[-161,-45]mm · 2 of 176 slices shown]
[im 59/176  soft-tissue]
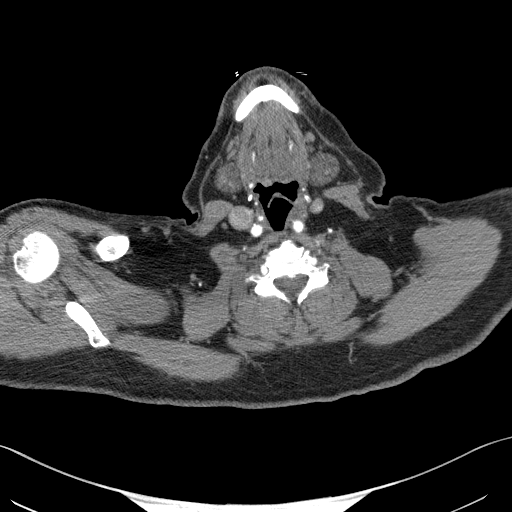
[im 117/176  soft-tissue]
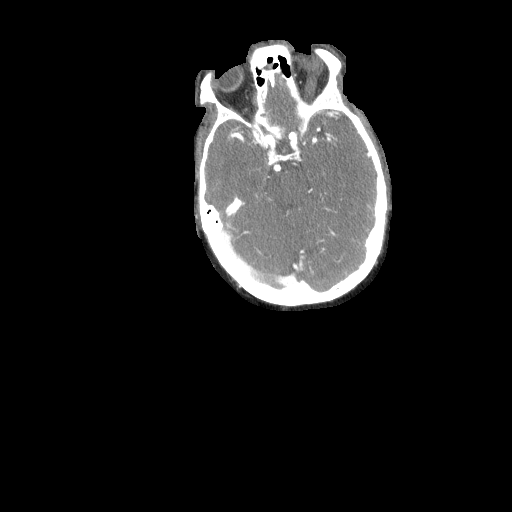

[Series 6: ax thins · axial · 0.49mm/px · z∈[-228,+25]mm · 6 of 355 slices shown]
[im 51/355  soft-tissue]
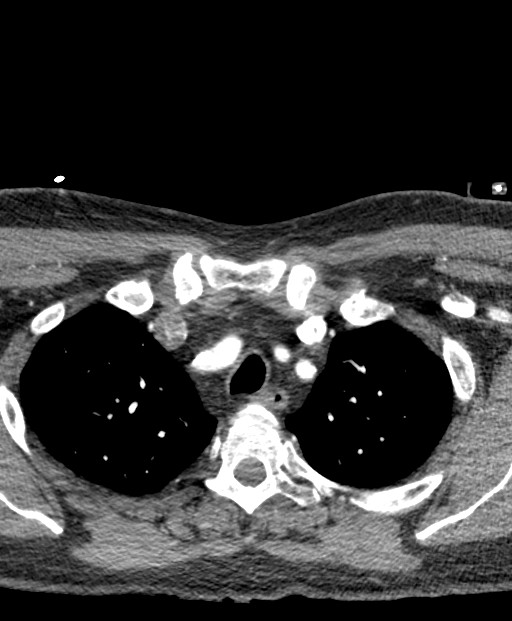
[im 102/355  bone]
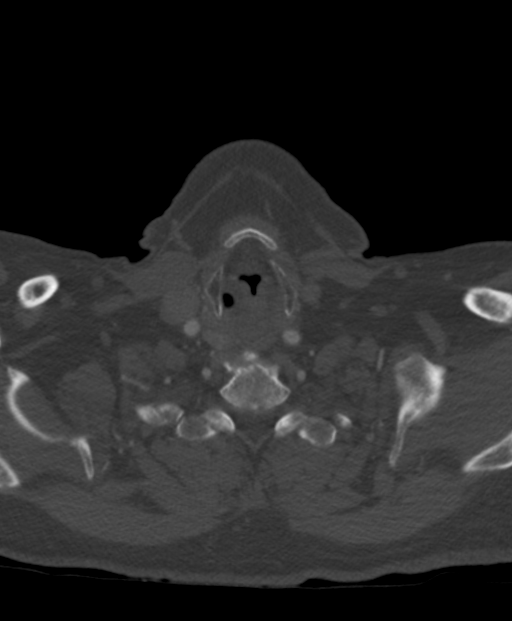
[im 152/355  soft-tissue]
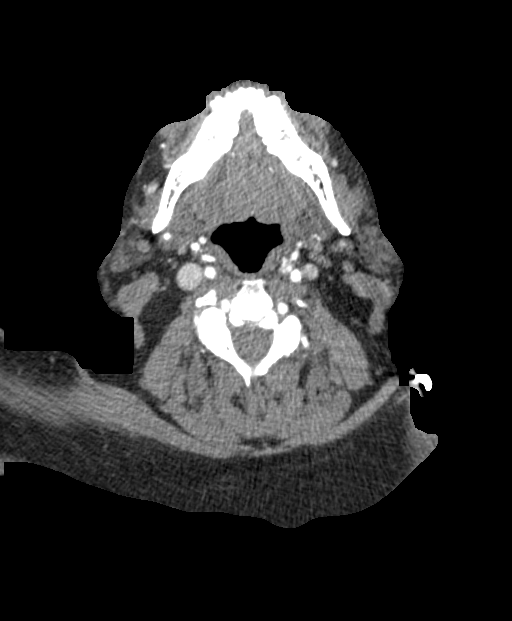
[im 203/355  bone]
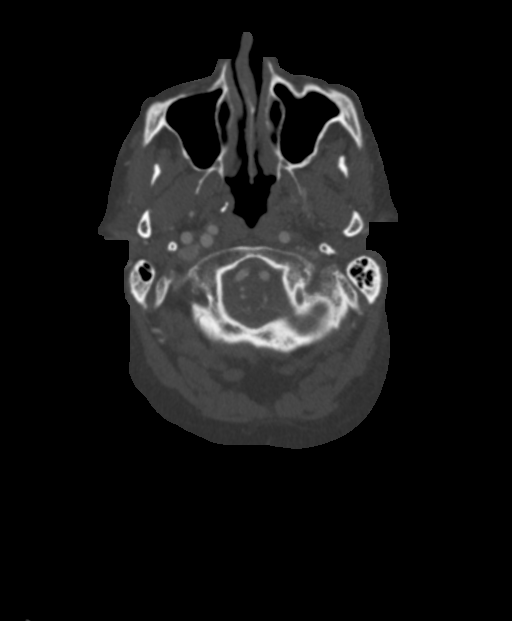
[im 253/355  soft-tissue]
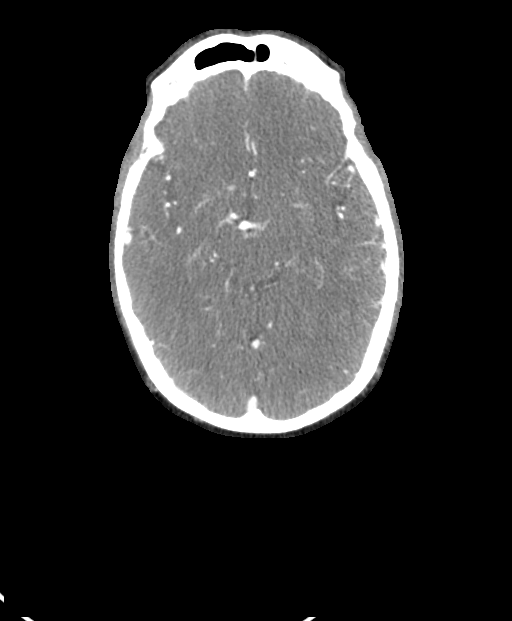
[im 304/355  bone]
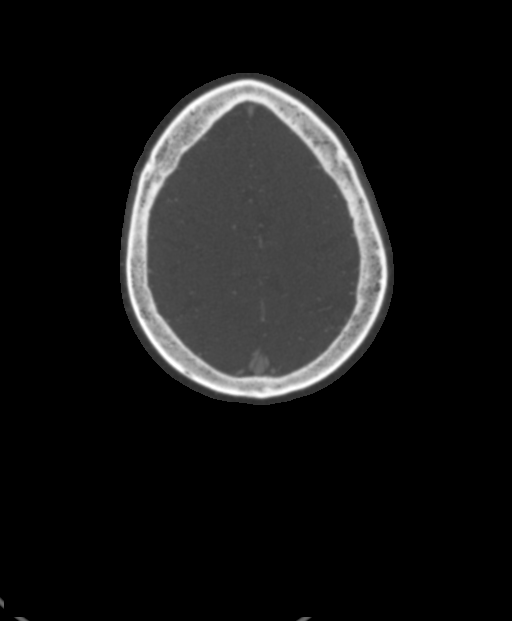

[Series 8: sag thin · sagittal · 0.60mm/px · 1 of 239 slices shown]
[im 122/239  soft-tissue]
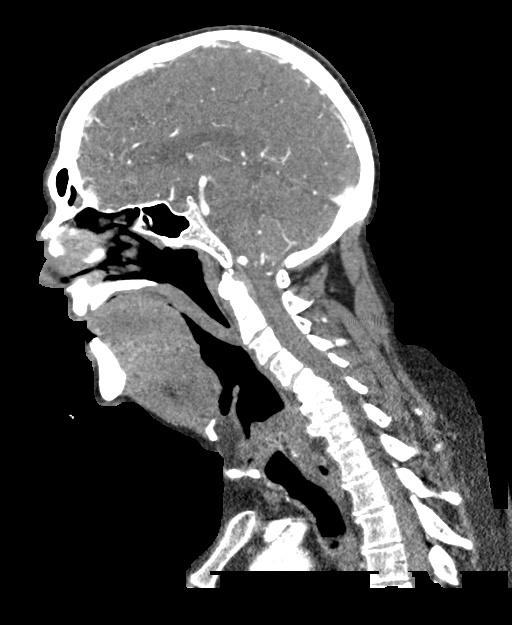

[9 of 35 positions shown; findings below may reference images not displayed]

FINDINGS: CTA NECK FINDINGS

Aortic arch: Standard 3 vessel aortic arch with moderate calcified
plaque. No significant arch vessel origin stenosis.

Right carotid system: Patent with mild-to-moderate calcified and
soft plaque at the carotid bifurcation. No evidence of significant
stenosis or dissection. Tortuous distal cervical ICA.

Left carotid system: Patent with moderate calcified and soft plaque
at the carotid bifurcation resulting in severe stenosis of the ECA
origin. No evidence of a significant common or internal carotid
artery stenosis.

Vertebral arteries: Patent and codominant without evidence of
stenosis or dissection.

Skeleton: Moderate lower cervical disc degeneration. Advanced
multilevel cervical facet arthrosis.

Other neck: Punctate calcifications in the left parotid gland. 9 mm
right thyroid nodule for which no imaging follow-up is recommended.

Upper chest: Clear lung apices.

Review of the MIP images confirms the above findings

CTA HEAD FINDINGS

Anterior circulation: The internal carotid arteries are patent from
skull base to carotid termini with calcified plaque resulting in
mild proximal supraclinoid stenosis bilaterally. ACAs and MCAs are
patent without evidence of a proximal branch occlusion or
significant proximal stenosis. No aneurysm is identified.

Posterior circulation: The intracranial vertebral arteries are
patent to the basilar with atherosclerosis resulting in mild
stenosis bilaterally. Patent PICA and SCA origins are seen
bilaterally. The basilar artery is patent with mild atherosclerotic
plaque bilaterally not resulting in a significant stenosis. There is
a moderately large right posterior communicating artery. The PCAs
are patent with atherosclerotic irregularity bilaterally as well as
moderate bilateral P2 stenoses. No aneurysm is identified.

Venous sinuses: Patent.

Anatomic variants: None.

Review of the MIP images confirms the above findings
IMPRESSION: 1. No large vessel occlusion.
2. Intracranial atherosclerosis with mild bilateral ICA, mild
bilateral vertebral artery, and moderate bilateral P2 stenoses.
3. Cervical carotid atherosclerosis without significant common
carotid or cervical internal carotid artery stenosis. Severe left
ECA origin stenosis.
4. Aortic Atherosclerosis ([3A]-[3A]).

## 2020-10-18 IMAGING — CT CT HEAD W/O CM
3 series · 15 of 47 positions shown, 18 images · non-contrast
Comparison: [DATE]

CLINICAL DATA: Dizziness, LEFT-sided weakness, LEFT facial droop
with LEFT side numbness, double vision, history hypertension

EXAM:
CT HEAD WITHOUT CONTRAST
TECHNIQUE: Contiguous axial images were obtained from the base of the skull
through the vertex without intravenous contrast. Sagittal and
coronal MPR images reconstructed from axial data set.

[Series 2: head w o · axial · 0.52mm/px · z∈[-117,+13]mm · 9 of 32 slices shown, 12 images]
[im 3/32  brain]
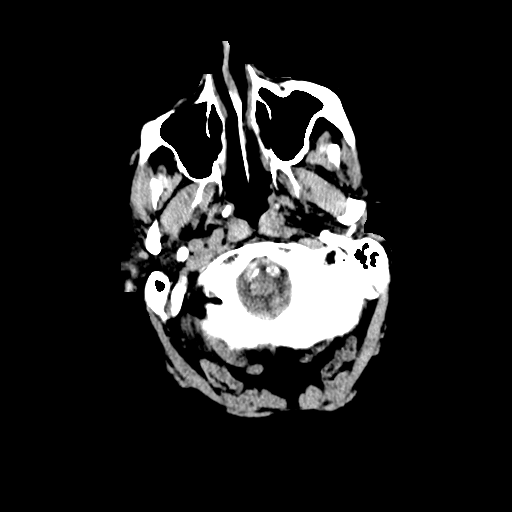
[im 3/32  bone]
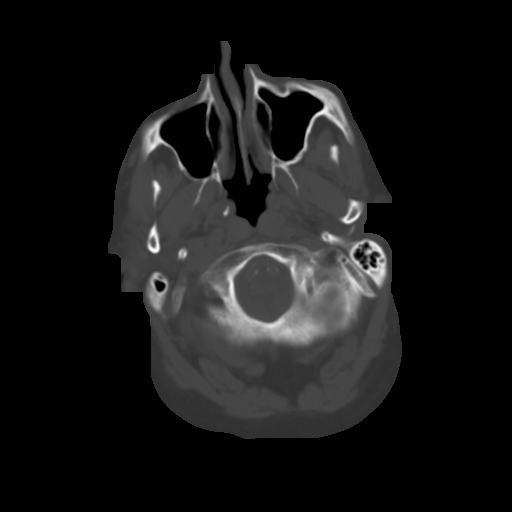
[im 6/32  brain]
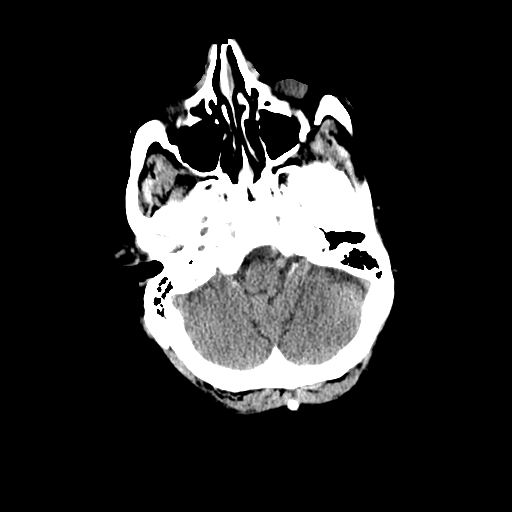
[im 9/32  brain]
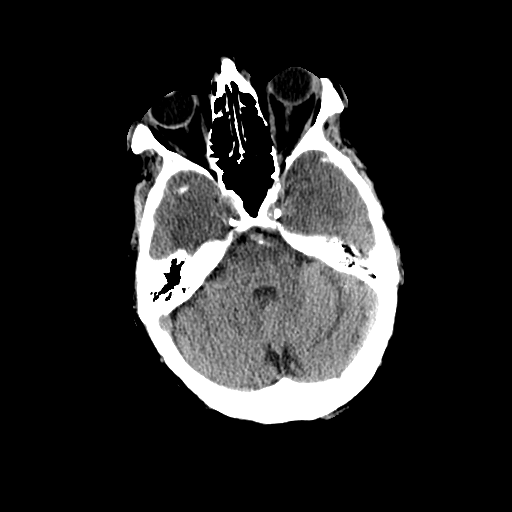
[im 12/32  brain]
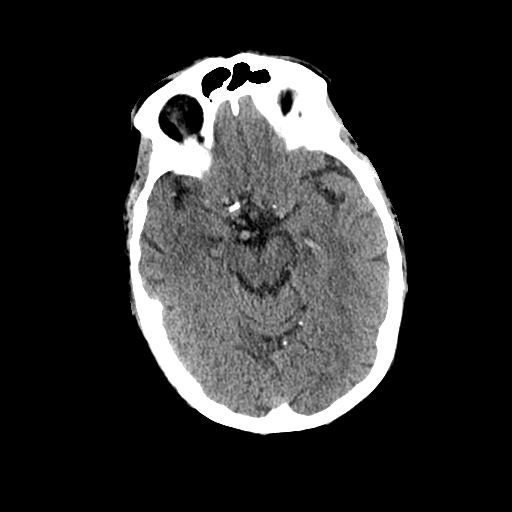
[im 17/32  brain]
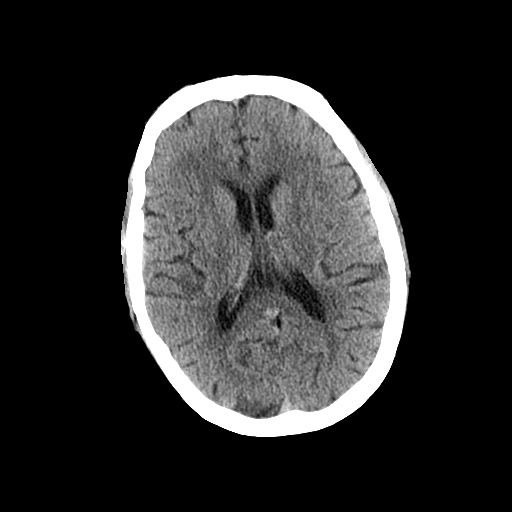
[im 17/32  bone]
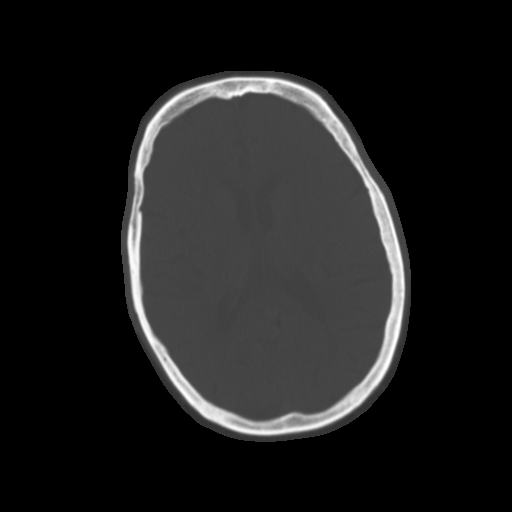
[im 20/32  brain]
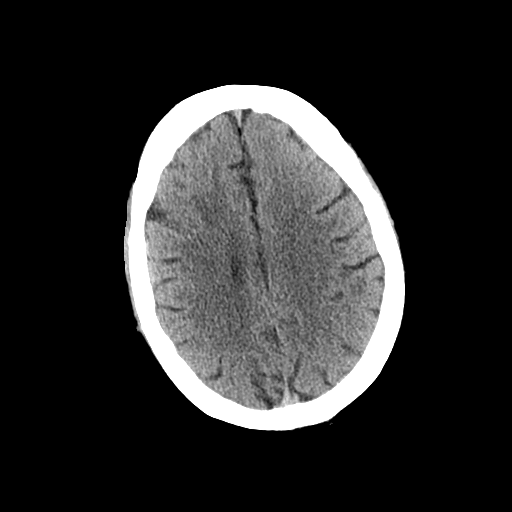
[im 23/32  brain]
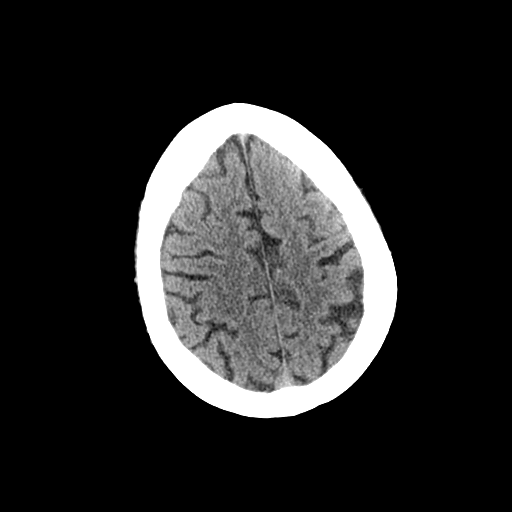
[im 26/32  brain]
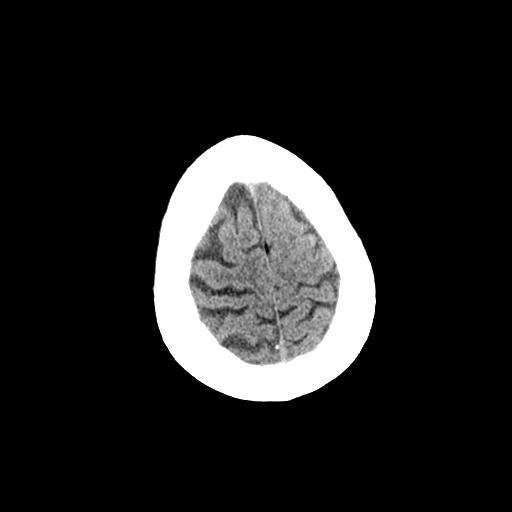
[im 29/32  brain]
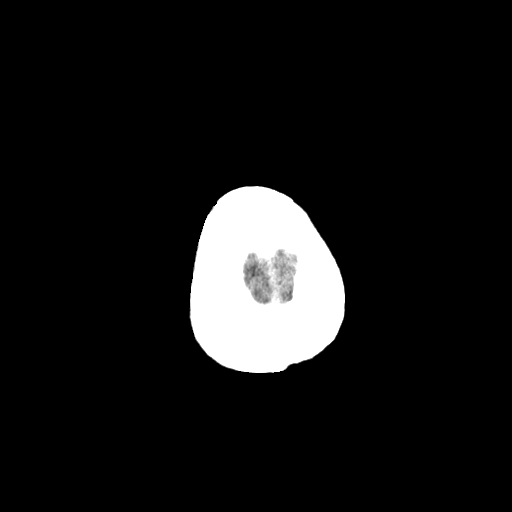
[im 29/32  bone]
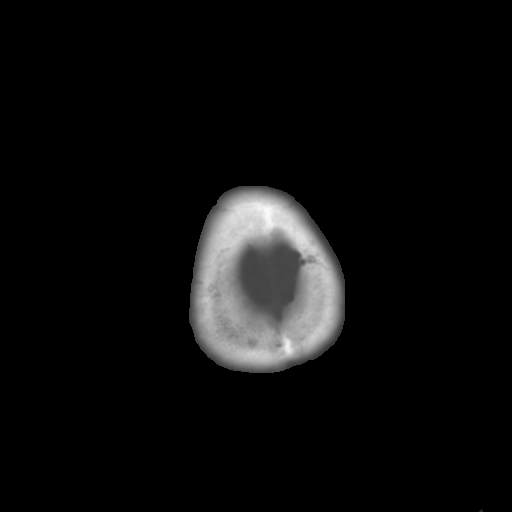

[Series 4: coronal soft · coronal · 0.33mm/px · 3 of 75 slices shown]
[im 25/75  brain]
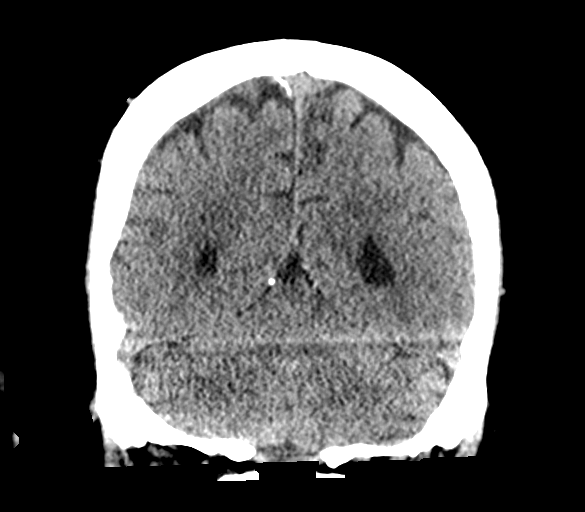
[im 33/75  brain]
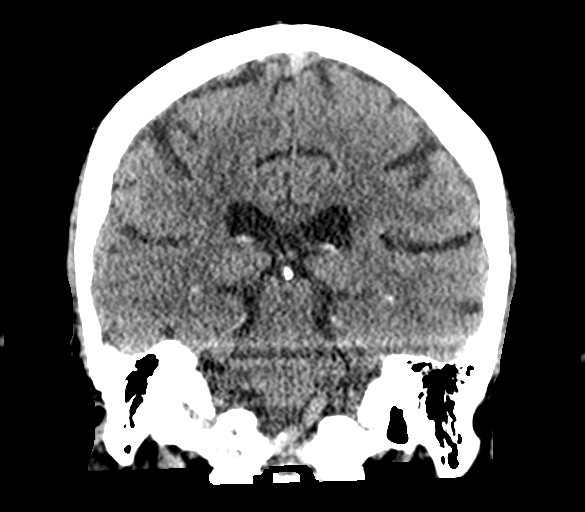
[im 42/75  brain]
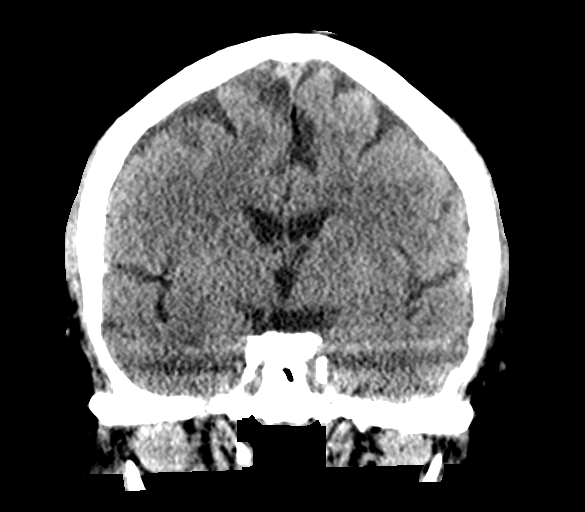

[Series 5: sagittal soft · sagittal · 0.34mm/px · 3 of 64 slices shown]
[im 22/64  brain]
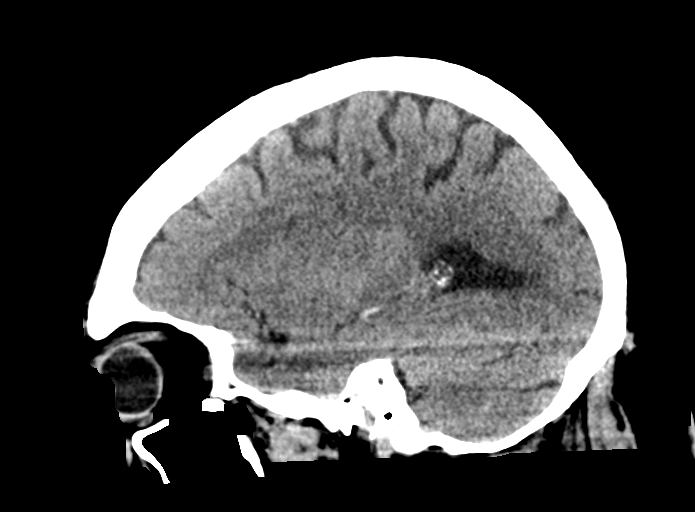
[im 32/64  brain]
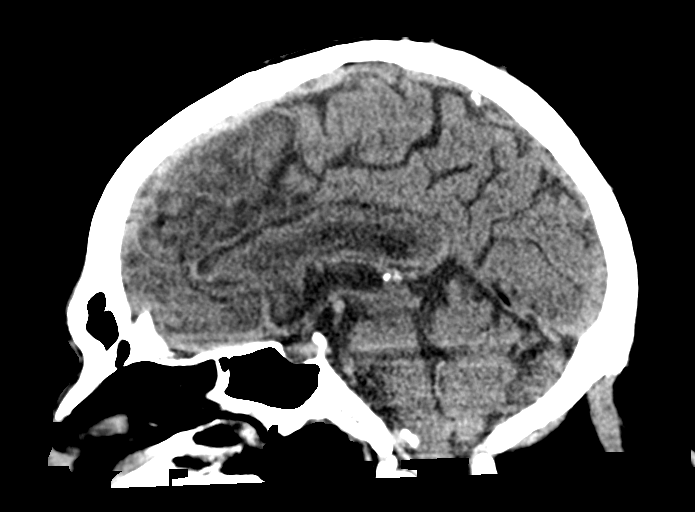
[im 43/64  brain]
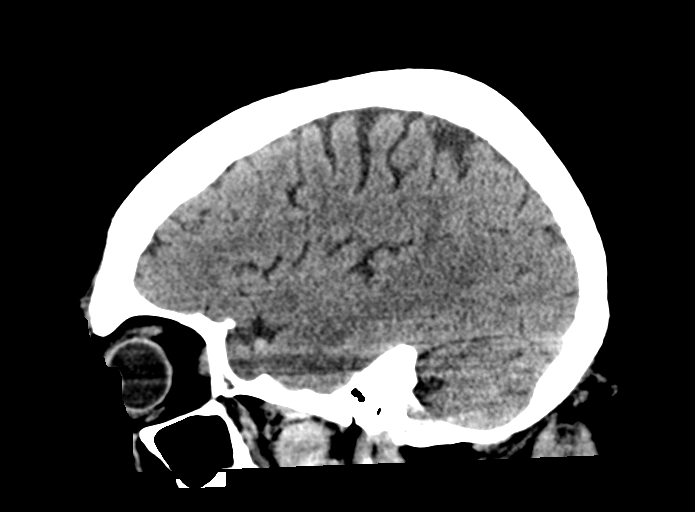

[15 of 47 positions shown; findings below may reference images not displayed]

FINDINGS: Brain: Generalized atrophy. Normal ventricular morphology. No
midline shift or mass effect. Small vessel chronic ischemic changes
of deep cerebral white matter. Small RIGHT frontal white matter
infarct. No intracranial hemorrhage, mass lesion, evidence of acute
infarction, or extra-axial fluid collection.

Vascular: No hyperdense vessels. Atherosclerotic calcification of
internal carotid and vertebral arteries at skull base

Skull: Intact

Sinuses/Orbits: Clear

Other: N/A
IMPRESSION: Atrophy with small vessel chronic ischemic changes of deep cerebral
white matter.

Small old RIGHT frontal white matter infarct.

No acute intracranial abnormalities.

## 2020-10-18 IMAGING — CT CT ANGIO HEAD
3 of 8 series · 8 of 35 positions shown · IV contrast (Omnipaque or Isovue)
Comparison: Head MRA [DATE]. Carotid Doppler ultrasound
[DATE].

CLINICAL DATA: Left-sided weakness, numbness, and facial droop.
Diplopia.

EXAM:
CT ANGIOGRAPHY HEAD AND NECK
TECHNIQUE: Multidetector CT imaging of the head and neck was performed using
the standard protocol during bolus administration of intravenous
contrast. Multiplanar CT image reconstructions and MIPs were
obtained to evaluate the vascular anatomy. Carotid stenosis
measurements (when applicable) are obtained utilizing NASCET
criteria, using the distal internal carotid diameter as the
denominator.
CONTRAST:  75mL OMNIPAQUE IOHEXOL 350 MG/ML SOLN

[Series 5: cta head & neck · axial · 0.48mm/px · z∈[-220,+11]mm · 5 of 694 slices shown]
[im 116/694  soft-tissue]
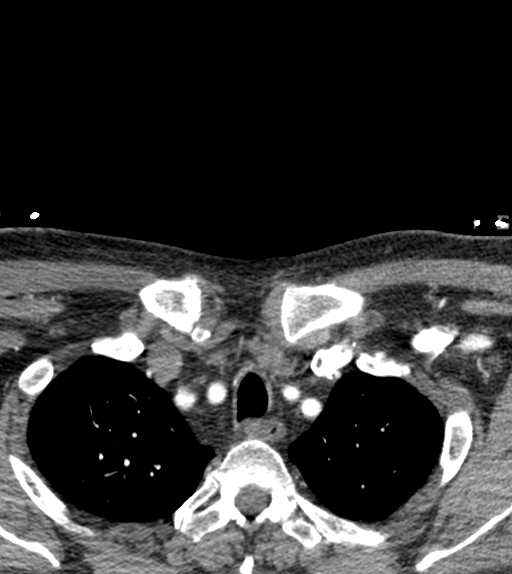
[im 232/694  bone]
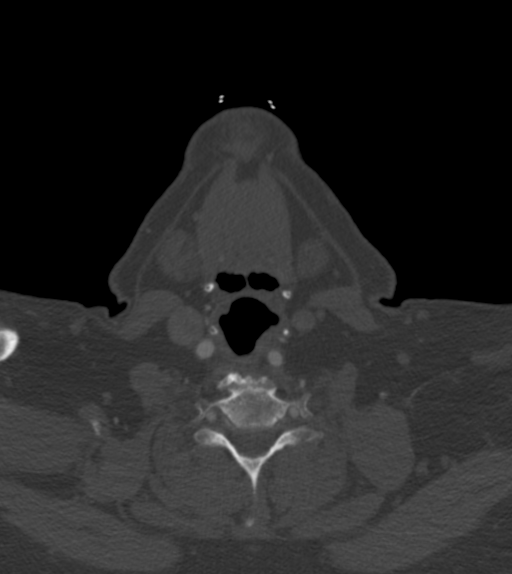
[im 347/694  soft-tissue]
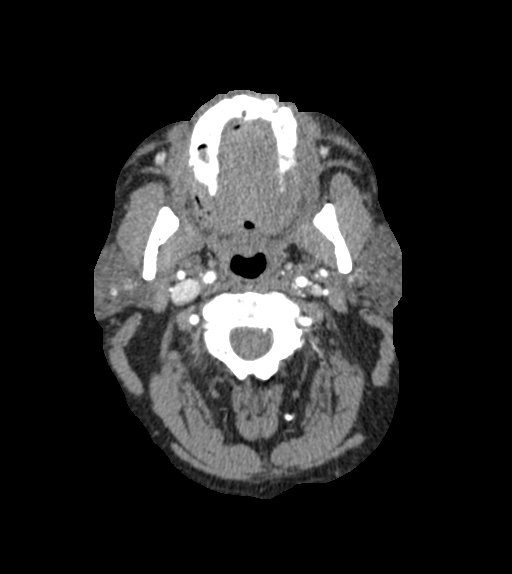
[im 463/694  bone]
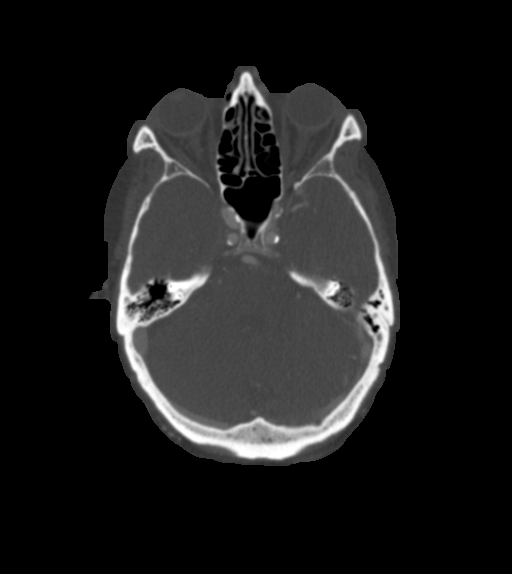
[im 578/694  soft-tissue]
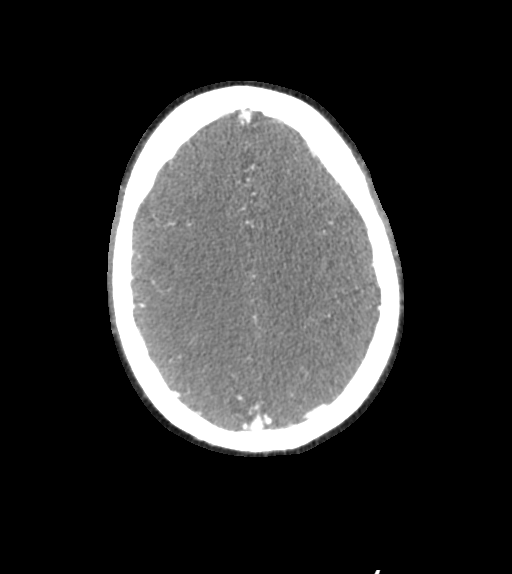

[Series 6: ax thins · axial · 0.49mm/px · z∈[-160,-42]mm · 2 of 355 slices shown]
[im 119/355  soft-tissue]
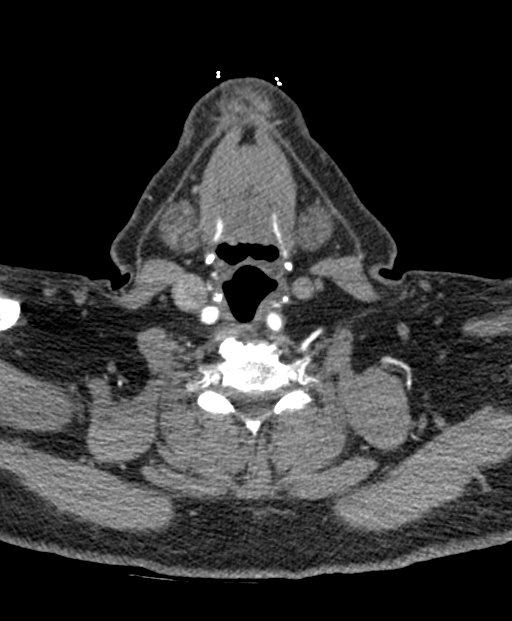
[im 237/355  soft-tissue]
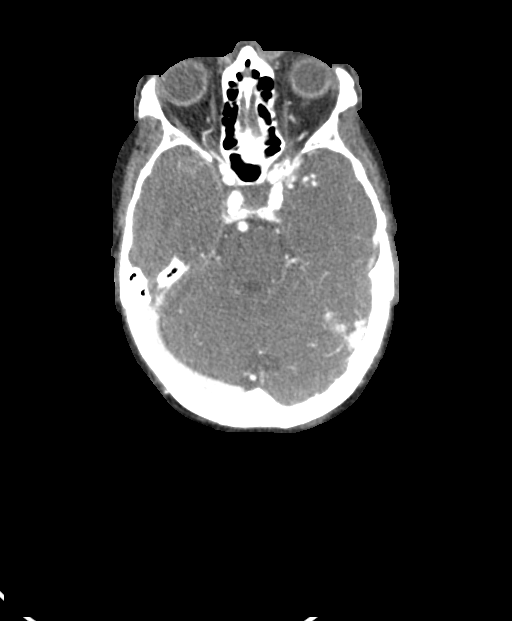

[Series 8: sag thin · sagittal · 0.60mm/px · 1 of 239 slices shown]
[im 122/239  soft-tissue]
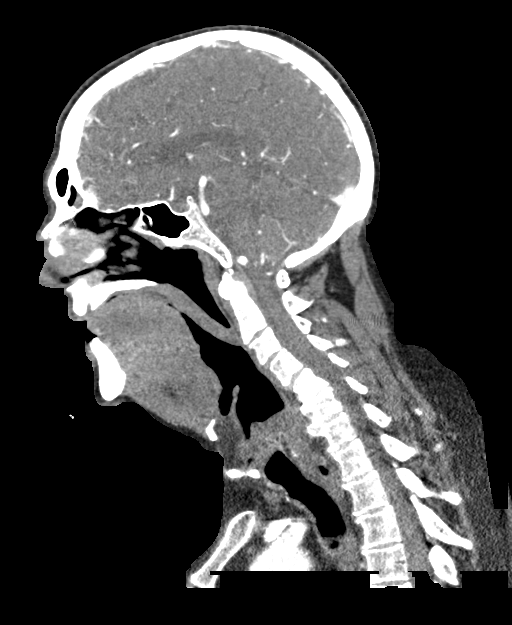

[8 of 35 positions shown; findings below may reference images not displayed]

FINDINGS: CTA NECK FINDINGS

Aortic arch: Standard 3 vessel aortic arch with moderate calcified
plaque. No significant arch vessel origin stenosis.

Right carotid system: Patent with mild-to-moderate calcified and
soft plaque at the carotid bifurcation. No evidence of significant
stenosis or dissection. Tortuous distal cervical ICA.

Left carotid system: Patent with moderate calcified and soft plaque
at the carotid bifurcation resulting in severe stenosis of the ECA
origin. No evidence of a significant common or internal carotid
artery stenosis.

Vertebral arteries: Patent and codominant without evidence of
stenosis or dissection.

Skeleton: Moderate lower cervical disc degeneration. Advanced
multilevel cervical facet arthrosis.

Other neck: Punctate calcifications in the left parotid gland. 9 mm
right thyroid nodule for which no imaging follow-up is recommended.

Upper chest: Clear lung apices.

Review of the MIP images confirms the above findings

CTA HEAD FINDINGS

Anterior circulation: The internal carotid arteries are patent from
skull base to carotid termini with calcified plaque resulting in
mild proximal supraclinoid stenosis bilaterally. ACAs and MCAs are
patent without evidence of a proximal branch occlusion or
significant proximal stenosis. No aneurysm is identified.

Posterior circulation: The intracranial vertebral arteries are
patent to the basilar with atherosclerosis resulting in mild
stenosis bilaterally. Patent PICA and SCA origins are seen
bilaterally. The basilar artery is patent with mild atherosclerotic
plaque bilaterally not resulting in a significant stenosis. There is
a moderately large right posterior communicating artery. The PCAs
are patent with atherosclerotic irregularity bilaterally as well as
moderate bilateral P2 stenoses. No aneurysm is identified.

Venous sinuses: Patent.

Anatomic variants: None.

Review of the MIP images confirms the above findings
IMPRESSION: 1. No large vessel occlusion.
2. Intracranial atherosclerosis with mild bilateral ICA, mild
bilateral vertebral artery, and moderate bilateral P2 stenoses.
3. Cervical carotid atherosclerosis without significant common
carotid or cervical internal carotid artery stenosis. Severe left
ECA origin stenosis.
4. Aortic Atherosclerosis ([3A]-[3A]).

## 2020-10-18 MED ORDER — MELOXICAM 7.5 MG PO TABS: 7.5000 mg | ORAL_TABLET | Freq: Two times a day (BID) | ORAL | Status: DC | PRN

## 2020-10-18 MED ORDER — HYDRALAZINE HCL 25 MG PO TABS: 25.0000 mg | ORAL_TABLET | Freq: Four times a day (QID) | ORAL | Status: DC | PRN

## 2020-10-18 MED ORDER — STROKE: EARLY STAGES OF RECOVERY BOOK
Freq: Once | Status: DC
  Filled 2020-10-18: qty 1

## 2020-10-18 MED ORDER — ACETAMINOPHEN 325 MG PO TABS: 650.0000 mg | ORAL_TABLET | ORAL | Status: DC | PRN

## 2020-10-18 MED ORDER — ACETAMINOPHEN 650 MG RE SUPP: 650.0000 mg | RECTAL | Status: DC | PRN

## 2020-10-18 MED ORDER — ALPRAZOLAM 0.5 MG PO TABS
0.2500 mg | ORAL_TABLET | ORAL | Status: AC
  Administered 2020-10-18: 20:00:00 0.25 mg via ORAL
  Filled 2020-10-18: qty 1

## 2020-10-18 MED ORDER — SENNOSIDES-DOCUSATE SODIUM 8.6-50 MG PO TABS: 1.0000 | ORAL_TABLET | Freq: Every evening | ORAL | Status: DC | PRN

## 2020-10-18 MED ORDER — ALLOPURINOL 100 MG PO TABS
300.0000 mg | ORAL_TABLET | Freq: Every day | ORAL | Status: DC
  Administered 2020-10-18 – 2020-10-20 (×3): 300 mg via ORAL
  Filled 2020-10-18 (×7): qty 3

## 2020-10-18 MED ORDER — INSULIN ASPART 100 UNIT/ML ~~LOC~~ SOLN: 0.0000 [IU] | Freq: Three times a day (TID) | SUBCUTANEOUS | Status: DC

## 2020-10-18 MED ORDER — IOHEXOL 350 MG/ML SOLN
75.0000 mL | Freq: Once | INTRAVENOUS | Status: AC | PRN
  Administered 2020-10-18: 18:00:00 75 mL via INTRAVENOUS

## 2020-10-18 MED ORDER — ACETAMINOPHEN 160 MG/5ML PO SOLN: 650.0000 mg | ORAL | Status: DC | PRN

## 2020-10-18 MED ORDER — ASPIRIN EC 81 MG PO TBEC
81.0000 mg | DELAYED_RELEASE_TABLET | Freq: Every day | ORAL | Status: DC
  Administered 2020-10-18 – 2020-10-20 (×3): 81 mg via ORAL
  Filled 2020-10-18 (×3): qty 1

## 2020-10-18 MED ORDER — ATORVASTATIN CALCIUM 10 MG PO TABS
20.0000 mg | ORAL_TABLET | Freq: Every day | ORAL | Status: DC
  Administered 2020-10-18 – 2020-10-19 (×2): 20 mg via ORAL
  Filled 2020-10-18 (×2): qty 2

## 2020-10-18 MED ORDER — SODIUM CHLORIDE 0.9 % IV SOLN: INTRAVENOUS | Status: DC

## 2020-10-18 MED ORDER — LORAZEPAM 0.5 MG PO TABS
0.5000 mg | ORAL_TABLET | ORAL | Status: DC | PRN
  Administered 2020-10-19: 19:00:00 0.5 mg via ORAL
  Filled 2020-10-18: qty 1

## 2020-10-18 MED ORDER — ENOXAPARIN SODIUM 40 MG/0.4ML ~~LOC~~ SOLN
40.0000 mg | SUBCUTANEOUS | Status: DC
  Administered 2020-10-18 – 2020-10-19 (×2): 40 mg via SUBCUTANEOUS
  Filled 2020-10-18 (×2): qty 0.4
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
## 2020-10-18 NOTE — ED Triage Notes (Signed)
Pt c/o of left side weakness. Pt reported left facial droop with left side numbness.  Pt stats he has double vision  And weakness on his left side.

## 2020-10-18 NOTE — ED Notes (Signed)
Pt given gingerale per MD approval.

## 2020-10-18 NOTE — ED Notes (Signed)
Timothy Hester, [facility] made aware of need for allopurinol for pt

## 2020-10-18 NOTE — ED Notes (Signed)
Pt ambulatory around the nurses station with standby assist. Pt has steady gait. Pt does veer to the left when walking. Pt has no complaints of dizziness or any other complaints. Dr Judd Lien made aware.

## 2020-10-18 NOTE — ED Notes (Signed)
Pt given sandwich and beverage.

## 2020-10-19 ENCOUNTER — Observation Stay (HOSPITAL_BASED_OUTPATIENT_CLINIC_OR_DEPARTMENT_OTHER): Payer: Medicare Other

## 2020-10-19 ENCOUNTER — Observation Stay (HOSPITAL_COMMUNITY): Payer: Medicare Other

## 2020-10-19 DIAGNOSIS — Z20822 Contact with and (suspected) exposure to covid-19: Secondary | ICD-10-CM | POA: Diagnosis not present

## 2020-10-19 DIAGNOSIS — H532 Diplopia: Secondary | ICD-10-CM

## 2020-10-19 DIAGNOSIS — G459 Transient cerebral ischemic attack, unspecified: Secondary | ICD-10-CM

## 2020-10-19 DIAGNOSIS — Z7982 Long term (current) use of aspirin: Secondary | ICD-10-CM | POA: Diagnosis not present

## 2020-10-19 DIAGNOSIS — R7303 Prediabetes: Secondary | ICD-10-CM | POA: Diagnosis not present

## 2020-10-19 DIAGNOSIS — Z7984 Long term (current) use of oral hypoglycemic drugs: Secondary | ICD-10-CM | POA: Diagnosis not present

## 2020-10-19 DIAGNOSIS — Z79899 Other long term (current) drug therapy: Secondary | ICD-10-CM | POA: Diagnosis not present

## 2020-10-19 DIAGNOSIS — R609 Edema, unspecified: Secondary | ICD-10-CM

## 2020-10-19 DIAGNOSIS — F17210 Nicotine dependence, cigarettes, uncomplicated: Secondary | ICD-10-CM | POA: Diagnosis not present

## 2020-10-19 DIAGNOSIS — H538 Other visual disturbances: Secondary | ICD-10-CM | POA: Diagnosis present

## 2020-10-19 DIAGNOSIS — I10 Essential (primary) hypertension: Secondary | ICD-10-CM | POA: Diagnosis not present

## 2020-10-19 DIAGNOSIS — R2243 Localized swelling, mass and lump, lower limb, bilateral: Secondary | ICD-10-CM | POA: Diagnosis not present

## 2020-10-19 LAB — BASIC METABOLIC PANEL
Anion gap: 10 (ref 5–15)
BUN: 16 mg/dL (ref 8–23)
CO2: 29 mmol/L (ref 22–32)
Calcium: 8.7 mg/dL — ABNORMAL LOW (ref 8.9–10.3)
Chloride: 90 mmol/L — ABNORMAL LOW (ref 98–111)
Creatinine, Ser: 1.61 mg/dL — ABNORMAL HIGH (ref 0.61–1.24)
GFR, Estimated: 46 mL/min — ABNORMAL LOW (ref >60)
Glucose, Bld: 94 mg/dL (ref 70–99)
Potassium: 3.8 mmol/L (ref 3.5–5.1)
Sodium: 129 mmol/L — ABNORMAL LOW (ref 135–145)

## 2020-10-19 LAB — HEMOGLOBIN A1C
Hgb A1c MFr Bld: 6.4 % — ABNORMAL HIGH (ref 4.8–5.6)
Mean Plasma Glucose: 136.98 mg/dL

## 2020-10-19 LAB — LIPID PANEL
Cholesterol: 209 mg/dL — ABNORMAL HIGH (ref 0–200)
HDL: 41 mg/dL (ref >40)
LDL Cholesterol: 137 mg/dL — ABNORMAL HIGH (ref 0–99)
Total CHOL/HDL Ratio: 5.1 RATIO
Triglycerides: 154 mg/dL — ABNORMAL HIGH (ref <150)
VLDL: 31 mg/dL (ref 0–40)

## 2020-10-19 LAB — TSH: TSH: 132.795 u[IU]/mL — ABNORMAL HIGH (ref 0.350–4.500)

## 2020-10-19 LAB — CBG MONITORING, ED: Glucose-Capillary: 87 mg/dL (ref 70–99)

## 2020-10-19 LAB — ECHOCARDIOGRAM COMPLETE
Area-P 1/2: 3.99 cm2
Calc EF: 66.8 %
Height: 68 in
S' Lateral: 2.1 cm
Single Plane A2C EF: 69.4 %
Single Plane A4C EF: 61.5 %
Weight: 3439.18 oz

## 2020-10-19 LAB — OSMOLALITY: Osmolality: 282 mOsm/kg (ref 275–295)

## 2020-10-19 LAB — GLUCOSE, CAPILLARY
Glucose-Capillary: 99 mg/dL (ref 70–99)
Glucose-Capillary: 111 mg/dL — ABNORMAL HIGH (ref 70–99)
Glucose-Capillary: 82 mg/dL (ref 70–99)

## 2020-10-19 LAB — OSMOLALITY, URINE: Osmolality, Ur: 309 mOsm/kg (ref 300–900)

## 2020-10-19 LAB — SEDIMENTATION RATE: Sed Rate: 22 mm/hr — ABNORMAL HIGH (ref 0–16)

## 2020-10-19 IMAGING — MR MR HEAD W/O CM
12 of 13 series · 44 of 48 positions shown · non-contrast
Comparison: CTA head and neck and head CT yesterday. Brain MRI
[DATE].

CLINICAL DATA: 69-year-old male TIA. Left side weakness, numbness,
facial droop, diplopia.

EXAM:
MRI HEAD WITHOUT CONTRAST
TECHNIQUE: Multiplanar, multiecho pulse sequences of the brain and surrounding
structures were obtained without intravenous contrast.

[Series 5: DWI · axial · 3.0mm · 0.88mm/px · z∈[-73,+73]mm · 8 of 100 slices shown (1 of 4)]
[im 1/100]
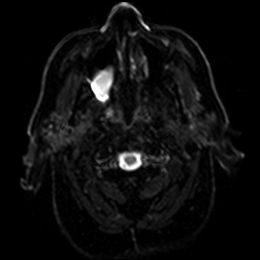
[im 15/100]
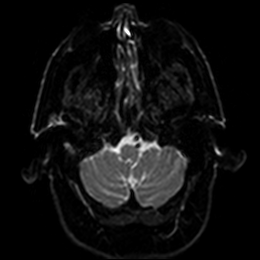
[im 29/100]
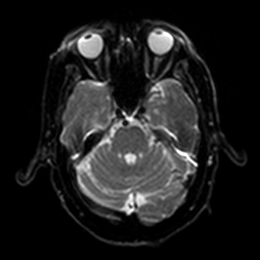
[im 43/100]
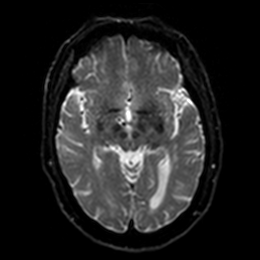
[im 57/100]
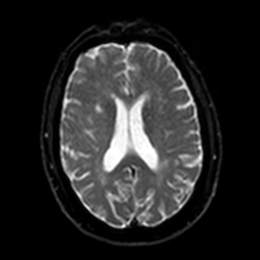
[im 71/100]
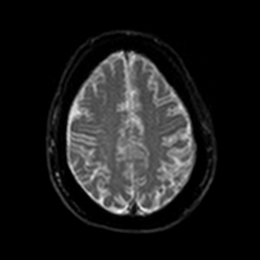
[im 85/100]
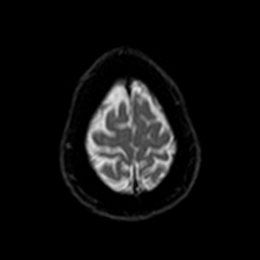
[im 100/100]
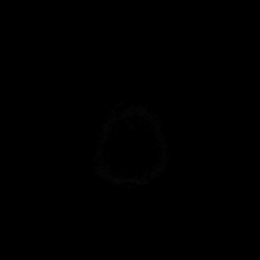

[Series 6: DWI · axial · 3.0mm · 0.88mm/px · z∈[-73,+73]mm · 4 of 50 slices shown (2 of 4)]
[im 1/50]
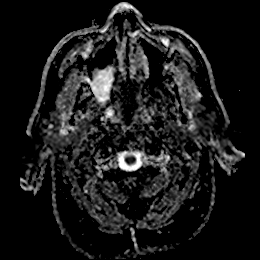
[im 17/50]
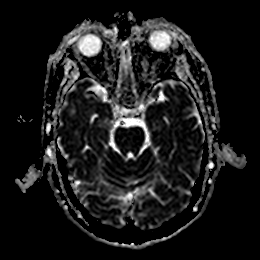
[im 33/50]
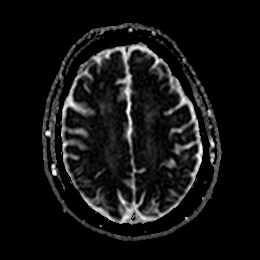
[im 50/50]
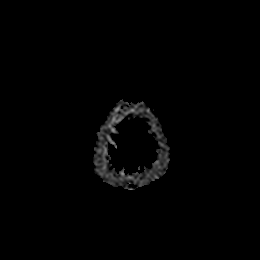

[Series 7: DWI · coronal · 4.0mm · 0.88mm/px · 5 of 70 slices shown (3 of 4)]
[im 1/70]
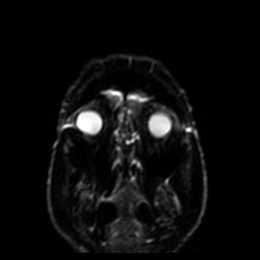
[im 18/70]
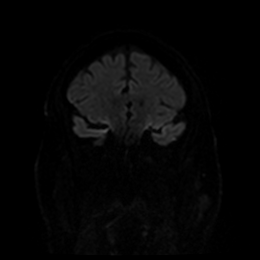
[im 35/70]
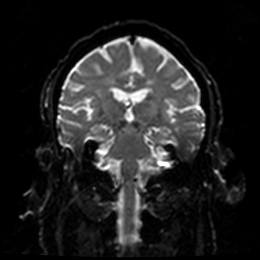
[im 52/70]
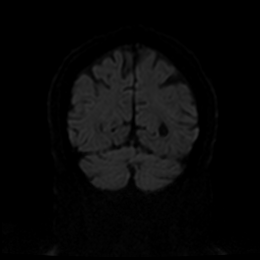
[im 70/70]
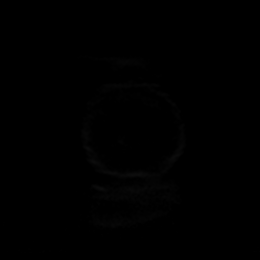

[Series 8: DWI · coronal · 4.0mm · 0.88mm/px · 3 of 35 slices shown (4 of 4)]
[im 1/35]
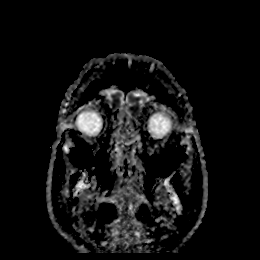
[im 18/35]
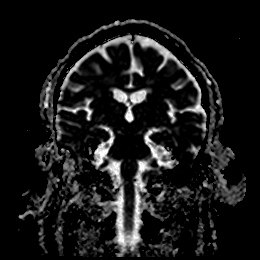
[im 35/35]
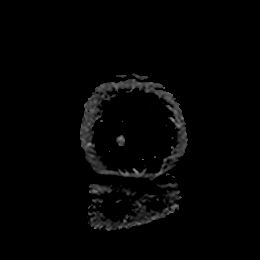

[Series 9: T1 · sagittal · 5.0mm · 0.75mm/px · 2 of 25 slices shown]
[im 1/25]
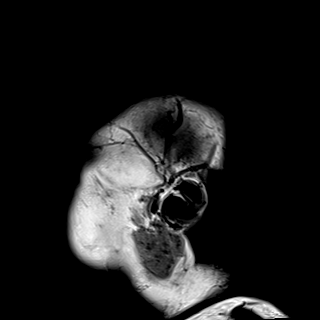
[im 25/25]
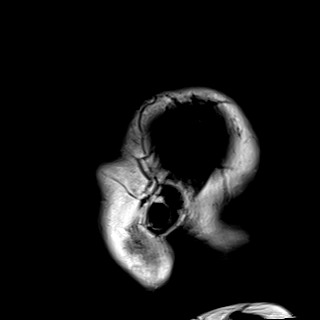

[Series 10: T2 · axial · 5.0mm · 0.72mm/px · z∈[-77,+67]mm · 2 of 25 slices shown (1 of 2)]
[im 1/25]
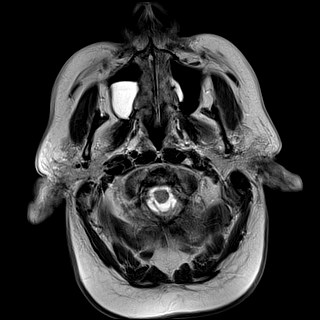
[im 25/25]
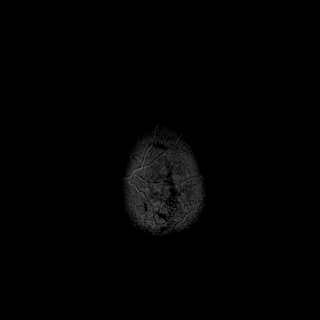

[Series 11: FLAIR · axial · 5.0mm · 0.90mm/px · z∈[-77,+67]mm · 2 of 25 slices shown]
[im 1/25]
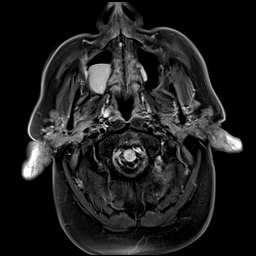
[im 25/25]
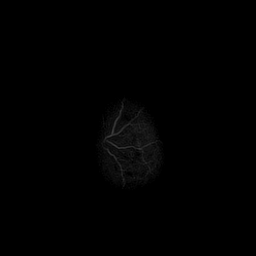

[Series 12: mag_images · axial · 3.0mm · 0.90mm/px · z∈[-81,+71]mm · 4 of 52 slices shown]
[im 1/52]
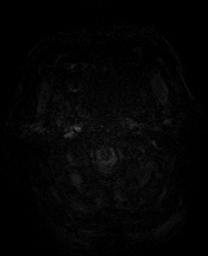
[im 18/52]
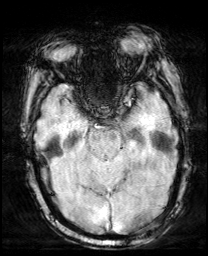
[im 35/52]
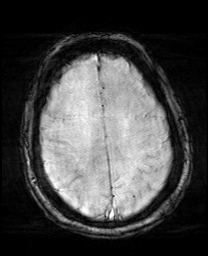
[im 52/52]
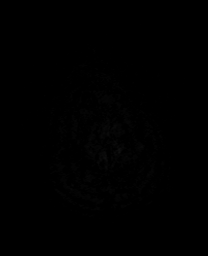

[Series 13: pha_images · axial · 3.0mm · 0.90mm/px · z∈[-81,+71]mm · 4 of 52 slices shown]
[im 1/52]
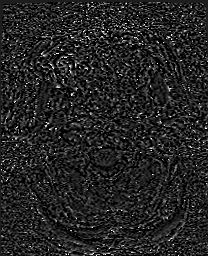
[im 18/52]
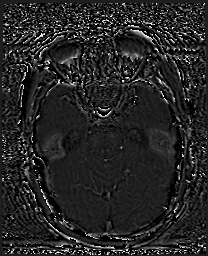
[im 35/52]
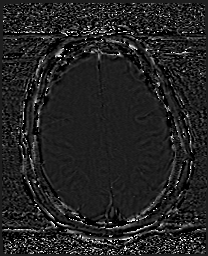
[im 52/52]
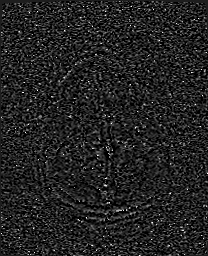

[Series 14: swi_images · axial · 3.0mm · 0.90mm/px · z∈[-81,+71]mm · 4 of 52 slices shown]
[im 1/52]
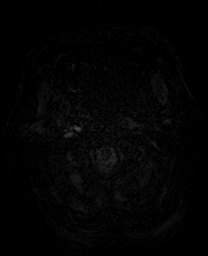
[im 18/52]
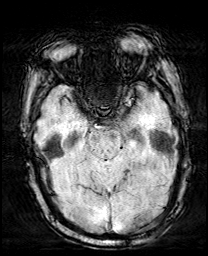
[im 35/52]
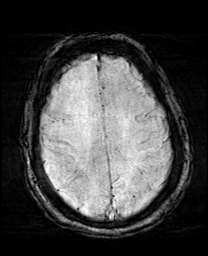
[im 52/52]
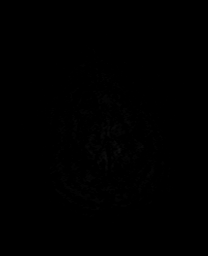

[Series 15: mip_images(sw) · axial · 24.0mm · 0.90mm/px · z∈[-71,+61]mm · 4 of 45 slices shown]
[im 1/45]
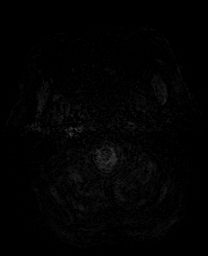
[im 15/45]
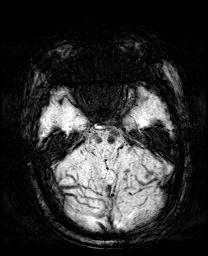
[im 30/45]
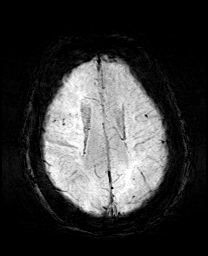
[im 45/45]
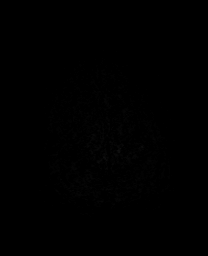

[Series 17: T2 · coronal · 5.0mm · 0.34mm/px · 2 of 29 slices shown (2 of 2)]
[im 1/29]
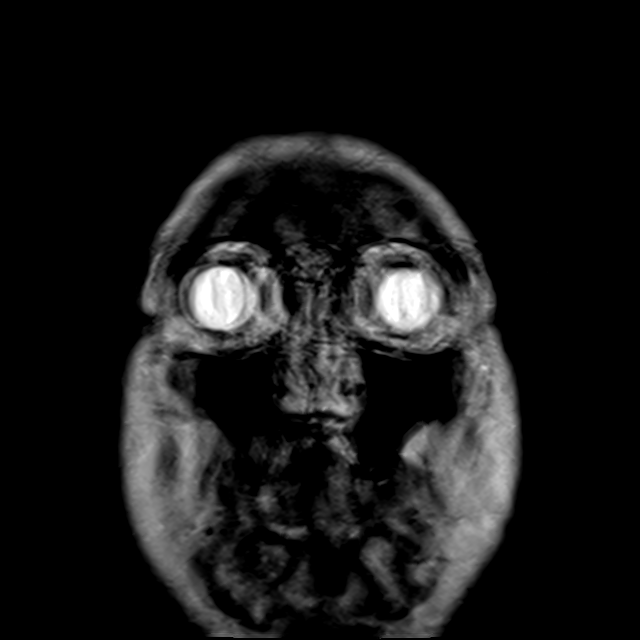
[im 29/29]
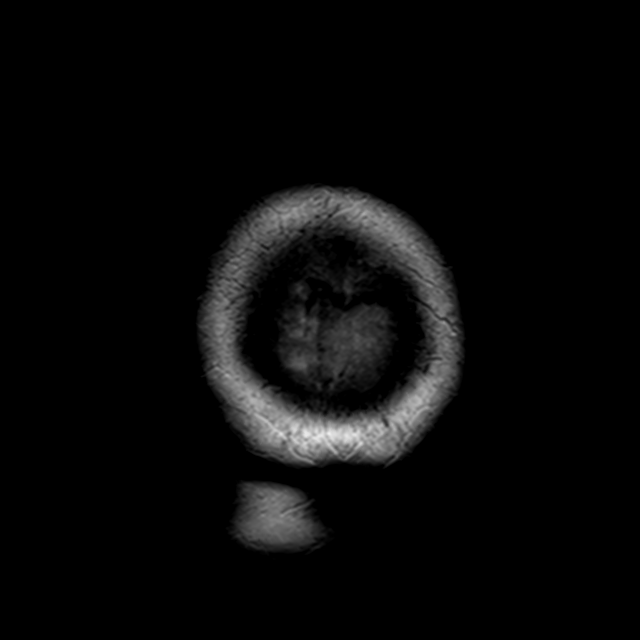

[44 of 48 positions shown; findings below may reference images not displayed]

FINDINGS: Brain: No restricted diffusion to suggest acute infarction. No
midline shift, mass effect, evidence of mass lesion,
ventriculomegaly, extra-axial collection or acute intracranial
hemorrhage. Cervicomedullary junction and pituitary are within
normal limits.

Widely scattered bilateral cerebral white matter T2 and FLAIR
hyperintensity, with some areas most resembling chronic white matter
lacunar infarcts (series 10, image 15). Relative sparing of the deep
gray nuclei as before. No cortical encephalomalacia or chronic
cerebral blood products identified. Brainstem and cerebellum are
within normal limits.

Vascular: Major intracranial vascular flow voids are stable [REDACTED]. Mild generalized arterial tortuosity.

Skull and upper cervical spine: Normal for age.

Sinuses/Orbits: Negative orbits. Chronic right maxillary sinus
mucous retention cyst and mild bilateral mucosal thickening.

Other: Trace right mastoid fluid is stable. Negative nasopharynx.
Grossly normal other visible internal auditory structures. Negative
visible scalp and face.
IMPRESSION: No acute intracranial abnormality with stable noncontrast MRI
appearance of the brain [REDACTED].
Moderate cerebral white matter signal changes most suggestive of
chronic small vessel disease.

## 2020-10-19 NOTE — Progress Notes (Signed)
Left lower extremity venous duplex has been completed. Preliminary results can be found in CV Proc through chart review.   10/19/20 3:27 PM Olen Cordial RVT

## 2020-10-19 NOTE — ED Notes (Signed)
Pt o2 saturations decreased to 88% while asleep. 96% while awake. Placed on 2l [place]

## 2020-10-19 NOTE — Progress Notes (Signed)
PROGRESS NOTE   Timothy Hester  XVQ:[id number] DOB: 11-29-1950 DOA: 10/18/2020 PCP: Joaquim Nam, MD   Chief Complaint  Patient presents with  . Weakness    Brief Admission History:  69 y.o. male with medical history significant of TIA, HTN,IIDM, lumbar stenosis status post laminectomy, gout, presented with sudden onset of double vision and left-sided weakness. CTA showed remote stroke, CT angiogram no major stenosis.  ED try to ambulate the patient however very unsteady and tends to fall. Pt admitted to [facility] for MRI as part of TIA work up.  Currently in [facility] ED awaiting transfer.    Assessment & Plan:   Active Problems:   TIA (transient ischemic attack)   1. TIA - Pt currently waiting for transfer to [facility] for MRI brain. He has been threatening to leave AMA.  He was told that he has a bed assignment and transportation being arranged.  No MRI available at [facility] for next 3 days.  2. Left leg edema - venous doppler study pending.  3. Hyponatremia - recheck BMP 4. Type 2 DM - carb modified diet and supplemental sliding scale insulin coverage ordered, check a1c.  5. Gout - stable.  6. Essential hypertension - BP stable.   DVT prophylaxis: enoxaparin Code Status: full  Family Communication: attempted to call wife, no answer Disposition: transfer to [facility] for MRI  Status is: Observation  The patient remains OBS appropriate and will d/c before 2 midnights.  Dispo: The patient is from: Home              Anticipated d/c is to: Home              Anticipated d/c date is: 10/19/2020                Consultants:     Procedures:     Antimicrobials:     Subjective: Pt says he is going home if he has to wait in the ED any longer.  He would leave AMA.  He says that his initial symptoms have resolved now.    Objective: Vitals:   10/19/20 0415 10/19/20 0430 10/19/20 0716 10/19/20 0746  BP:  113/65  126/80  Pulse:  (!) 57 62 75  Resp:  15 12 18   Temp:      TempSrc:      SpO2: 95%   97% 97%  Weight:      Height:        Intake/Output Summary (Last 24 hours) at 10/19/2020 0834 Last data filed at 10/19/2020 0746 Gross per 24 hour  Intake --  Output 150 ml  Net -150 ml   Filed Weights   10/18/20 1520  Weight: 97.5 kg    Examination:  General exam: Appears calm and comfortable. Normal speech pattern.   Respiratory system: Clear to auscultation. Respiratory effort normal. Cardiovascular system: S1 & S2 heard, RRR. No JVD, murmurs, rubs, gallops or clicks. No pedal edema. Gastrointestinal system: Abdomen is nondistended, soft and nontender. No organomegaly or masses felt. Normal bowel sounds heard. Central nervous system: Alert and oriented. No focal neurological deficits. Extremities: Symmetric 5 x 5 power. Skin: No rashes, lesions or ulcers Psychiatry: Judgement and insight appear normal. Mood & affect appropriate.   Data Reviewed: I have personally reviewed following labs and imaging studies  CBC: Recent Labs  Lab 10/18/20 1522  WBC 6.9  NEUTROABS 4.1  HGB 14.5  HCT 43.8  MCV 98.4  PLT 206    Basic Metabolic Panel: Recent  Labs  Lab 10/18/20 1522  NA 129*  K 4.0  CL 91*  CO2 29  GLUCOSE 87  BUN 16  CREATININE 1.39*  CALCIUM 9.0    GFR: Estimated Creatinine Clearance: 56 mL/min (A) (by C-G formula based on SCr of 1.39 mg/dL (H)).  Liver Function Tests: Recent Labs  Lab 10/18/20 1522  AST 25  ALT 15  ALKPHOS 38  BILITOT 0.6  PROT 8.4*  ALBUMIN 4.9    CBG: Recent Labs  Lab 10/18/20 2324 10/19/20 0833  GLUCAP 110* 87    Recent Results (from the past 240 hour(s))  Resp Panel by RT-PCR (Flu A&B, Covid) Nasopharyngeal Swab     Status: None   Collection Time: 10/18/20  7:25 PM   Specimen: Nasopharyngeal Swab; Nasopharyngeal(NP) swabs in vial transport medium  Result Value Ref Range Status   SARS Coronavirus 2 by RT PCR NEGATIVE NEGATIVE Final    Comment: (NOTE) SARS-CoV-2 target nucleic acids are NOT DETECTED.  The  SARS-CoV-2 RNA is generally detectable in upper respiratory specimens during the acute phase of infection. The lowest concentration of SARS-CoV-2 viral copies this assay can detect is 138 copies/mL. A negative result does not preclude SARS-Cov-2 infection and should not be used as the sole basis for treatment or other patient management decisions. A negative result may occur with  improper specimen collection/handling, submission of specimen other than nasopharyngeal swab, presence of viral mutation(s) within the areas targeted by this assay, and inadequate number of viral copies(<138 copies/mL). A negative result must be combined with clinical observations, patient history, and epidemiological information. The expected result is Negative.  Fact Sheet for Patients:  BloggerCourse.com  Fact Sheet for Healthcare Providers:  SeriousBroker.it  This test is no t yet approved or cleared by the [place] FDA and  has been authorized for detection and/or diagnosis of SARS-CoV-2 by FDA under an Emergency Use Authorization (EUA). This EUA will remain  in effect (meaning this test can be used) for the duration of the COVID-19 declaration under Section 564(b)(1) of the Act, 21 U.S.C.section 360bbb-3(b)(1), unless the authorization is terminated  or revoked sooner.       Influenza A by PCR NEGATIVE NEGATIVE Final   Influenza B by PCR NEGATIVE NEGATIVE Final    Comment: (NOTE) The Xpert Xpress SARS-CoV-2/FLU/RSV plus assay is intended as an aid in the diagnosis of influenza from Nasopharyngeal swab specimens and should not be used as a sole basis for treatment. Nasal washings and aspirates are unacceptable for Xpert Xpress SARS-CoV-2/FLU/RSV testing.  Fact Sheet for Patients: BloggerCourse.com  Fact Sheet for Healthcare Providers: SeriousBroker.it  This test is not yet approved or  cleared by the [place] FDA and has been authorized for detection and/or diagnosis of SARS-CoV-2 by FDA under an Emergency Use Authorization (EUA). This EUA will remain in effect (meaning this test can be used) for the duration of the COVID-19 declaration under Section 564(b)(1) of the Act, 21 U.S.C. section 360bbb-3(b)(1), unless the authorization is terminated or revoked.  Performed at [facility], [address], [place], [place] [address]      Radiology Studies: CT Angio Head W or Wo Contrast  Result Date: 10/18/2020 CLINICAL DATA:  Left-sided weakness, numbness, and facial droop. Diplopia. EXAM: CT ANGIOGRAPHY HEAD AND NECK TECHNIQUE: Multidetector CT imaging of the head and neck was performed using the standard protocol during bolus administration of intravenous contrast. Multiplanar CT image reconstructions and MIPs were obtained to evaluate the vascular anatomy. Carotid stenosis measurements (when applicable)  are obtained utilizing NASCET criteria, using the distal internal carotid diameter as the denominator. CONTRAST:  29mL OMNIPAQUE IOHEXOL 350 MG/ML SOLN COMPARISON:  Head MRA 03/14/2020. Carotid Doppler ultrasound 03/14/2020. FINDINGS: CTA NECK FINDINGS Aortic arch: Standard 3 vessel aortic arch with moderate calcified plaque. No significant arch vessel origin stenosis. Right carotid system: Patent with mild-to-moderate calcified and soft plaque at the carotid bifurcation. No evidence of significant stenosis or dissection. Tortuous distal cervical ICA. Left carotid system: Patent with moderate calcified and soft plaque at the carotid bifurcation resulting in severe stenosis of the ECA origin. No evidence of a significant common or internal carotid artery stenosis. Vertebral arteries: Patent and codominant without evidence of stenosis or dissection. Skeleton: Moderate lower cervical disc degeneration. Advanced multilevel cervical facet arthrosis. Other neck: Punctate calcifications in  the left parotid gland. 9 mm right thyroid nodule for which no imaging follow-up is recommended. Upper chest: Clear lung apices. Review of the MIP images confirms the above findings CTA HEAD FINDINGS Anterior circulation: The internal carotid arteries are patent from skull base to carotid termini with calcified plaque resulting in mild proximal supraclinoid stenosis bilaterally. ACAs and MCAs are patent without evidence of a proximal branch occlusion or significant proximal stenosis. No aneurysm is identified. Posterior circulation: The intracranial vertebral arteries are patent to the basilar with atherosclerosis resulting in mild stenosis bilaterally. Patent PICA and SCA origins are seen bilaterally. The basilar artery is patent with mild atherosclerotic plaque bilaterally not resulting in a significant stenosis. There is a moderately large right posterior communicating artery. The PCAs are patent with atherosclerotic irregularity bilaterally as well as moderate bilateral P2 stenoses. No aneurysm is identified. Venous sinuses: Patent. Anatomic variants: None. Review of the MIP images confirms the above findings IMPRESSION: 1. No large vessel occlusion. 2. Intracranial atherosclerosis with mild bilateral ICA, mild bilateral vertebral artery, and moderate bilateral P2 stenoses. 3. Cervical carotid atherosclerosis without significant common carotid or cervical internal carotid artery stenosis. Severe left ECA origin stenosis. 4. Aortic Atherosclerosis (ICD10-I70.0). Electronically Signed   By: Sebastian Ache M.D.   On: 10/18/2020 18:20   DG Chest 2 View  Result Date: 10/18/2020 CLINICAL DATA:  Left-sided weakness, left-sided facial droop EXAM: CHEST - 2 VIEW COMPARISON:  04/05/2020 FINDINGS: The heart size and mediastinal contours are within normal limits. Both lungs are clear. The visualized skeletal structures are unremarkable. IMPRESSION: No active cardiopulmonary disease. Electronically Signed   By: Sharlet Salina M.D.   On: 10/18/2020 20:10   CT Head Wo Contrast  Result Date: 10/18/2020 CLINICAL DATA:  Dizziness, LEFT-sided weakness, LEFT facial droop with LEFT side numbness, double vision, history hypertension EXAM: CT HEAD WITHOUT CONTRAST TECHNIQUE: Contiguous axial images were obtained from the base of the skull through the vertex without intravenous contrast. Sagittal and coronal MPR images reconstructed from axial data set. COMPARISON:  03/13/2020 FINDINGS: Brain: Generalized atrophy. Normal ventricular morphology. No midline shift or mass effect. Small vessel chronic ischemic changes of deep cerebral white matter. Small RIGHT frontal white matter infarct. No intracranial hemorrhage, mass lesion, evidence of acute infarction, or extra-axial fluid collection. Vascular: No hyperdense vessels. Atherosclerotic calcification of internal carotid and vertebral arteries at skull base Skull: Intact Sinuses/Orbits: Clear Other: N/A IMPRESSION: Atrophy with small vessel chronic ischemic changes of deep cerebral white matter. 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 No acute intracranial abnormalities. Electronically Signed   By: Ulyses Southward M.D.   On: 10/18/2020 16:02   CT Angio Neck W and/or Wo Contrast  Result Date: 10/18/2020 CLINICAL DATA:  Left-sided weakness, numbness, and facial droop. Diplopia. EXAM: CT ANGIOGRAPHY HEAD AND NECK TECHNIQUE: Multidetector CT imaging of the head and neck was performed using the standard protocol during bolus administration of intravenous contrast. Multiplanar CT image reconstructions and MIPs were obtained to evaluate the vascular anatomy. Carotid stenosis measurements (when applicable) are obtained utilizing NASCET criteria, using the distal internal carotid diameter as the denominator. CONTRAST:  75mL OMNIPAQUE IOHEXOL 350 MG/ML SOLN COMPARISON:  Head MRA 03/14/2020. Carotid Doppler ultrasound 03/14/2020. FINDINGS: CTA NECK FINDINGS Aortic arch: Standard 3 vessel  aortic arch with moderate calcified plaque. No significant arch vessel origin stenosis. Right carotid system: Patent with mild-to-moderate calcified and soft plaque at the carotid bifurcation. No evidence of significant stenosis or dissection. Tortuous distal cervical ICA. Left carotid system: Patent with moderate calcified and soft plaque at the carotid bifurcation resulting in severe stenosis of the ECA origin. No evidence of a significant common or internal carotid artery stenosis. Vertebral arteries: Patent and codominant without evidence of stenosis or dissection. Skeleton: Moderate lower cervical disc degeneration. Advanced multilevel cervical facet arthrosis. Other neck: Punctate calcifications in the left parotid gland. 9 mm right thyroid nodule for which no imaging follow-up is recommended. Upper chest: Clear lung apices. Review of the MIP images confirms the above findings CTA HEAD FINDINGS Anterior circulation: The internal carotid arteries are patent from skull base to carotid termini with calcified plaque resulting in mild proximal supraclinoid stenosis bilaterally. ACAs and MCAs are patent without evidence of a proximal branch occlusion or significant proximal stenosis. No aneurysm is identified. Posterior circulation: The intracranial vertebral arteries are patent to the basilar with atherosclerosis resulting in mild stenosis bilaterally. Patent PICA and SCA origins are seen bilaterally. The basilar artery is patent with mild atherosclerotic plaque bilaterally not resulting in a significant stenosis. There is a moderately large right posterior communicating artery. The PCAs are patent with atherosclerotic irregularity bilaterally as well as moderate bilateral P2 stenoses. No aneurysm is identified. Venous sinuses: Patent. Anatomic variants: None. Review of the MIP images confirms the above findings IMPRESSION: 1. No large vessel occlusion. 2. Intracranial atherosclerosis with mild bilateral ICA, mild  bilateral vertebral artery, and moderate bilateral P2 stenoses. 3. Cervical carotid atherosclerosis without significant common carotid or cervical internal carotid artery stenosis. Severe left ECA origin stenosis. 4. Aortic Atherosclerosis (ICD10-I70.0). Electronically Signed   By: Sebastian AcheAllen  Grady M.D.   On: 10/18/2020 18:20   Scheduled Meds: .  stroke: mapping our early stages of recovery book   Does not apply Once  . allopurinol  300 mg Oral Daily  . aspirin EC  81 mg Oral Daily  . atorvastatin  20 mg Oral Daily  . enoxaparin (LOVENOX) injection  40 mg Subcutaneous Q24H  . insulin aspart  0-15 Units Subcutaneous TID WC   Continuous Infusions:   LOS: 0 days   Time spent: 35 mins  Roseanna Koplin Laural BenesJohnson, MD How to contact the [facility] Attending or Consulting provider 7A - 7P or covering provider during after hours 7P -7A, for this patient?  1. Check the care team in [facility] and look for a) attending/consulting [facility] provider listed and b) the [facility] team listed 2. Log into [email or web address] and use [place]'s universal password to access. If you do not have the password, please contact the hospital operator. 3. Locate the [facility] provider you are looking for under [facility] and page to a number that you can be directly reached. 4. If you still have difficulty  reaching the provider, please page the [facility] (Director on Call) for the Hospitalists listed on amion for assistance.  10/19/2020, 8:34 AM

## 2020-10-19 NOTE — Progress Notes (Signed)
  Echocardiogram 2D Echocardiogram has been performed.  Timothy Hester 10/19/2020, 4:13 PM

## 2020-10-19 NOTE — Progress Notes (Signed)
69 yo male is transferred from [facility] for neurology consult and MRI.  He has history of TIA type 2 diabetes hypertension gout history of laminectomy lumbar stenosis, he was admitted with double vision and left-sided weakness.  Patient's wife tells me that he is more weaker on the left side than his right and he has left lower extremity swelling.  CT of the head did not reveal anything acute.  Patient sitting up in chair wife by the bedside.  His presenting symptoms have been resolved. Work-up shows a very elevated TSH of 132.7.,  Elevated LDL 137.  MRI has not been done yet. Neuro to follow-up. T3-T4 added. Hemoglobin A1c from August 2021 is 6.3 I do not have one from this admission.\Check T3-T4 A1c left lower extremity venous Doppler to rule out DVT.
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
## 2020-10-19 NOTE — Care Management Obs Status (Signed)
MEDICARE OBSERVATION STATUS NOTIFICATION   Patient Details  Name: Timothy Hester MRN: [id number] Date of Birth: 1951/08/03   Medicare Observation Status Notification Given:  Yes    Lawerance Sabal, RN 10/19/2020, 2:33 PM

## 2020-10-19 NOTE — ED Notes (Signed)
Attempted to give report, RN Misty Stanley to call back when available. [facility] here to pick up pt for transport to [facility].

## 2020-10-19 NOTE — Evaluation (Signed)
Physical Therapy Evaluation Patient Details Name: Timothy Hester MRN: [id number] DOB: Oct 29, 1950 Today's Date: 10/19/2020   History of Present Illness  Pt is a 69 y.o. male admitted to [facility] 10/18/20 with suddent onset of double vision and L-side weakness, delayed coming to hospital until next day; transfer to [facility] for stroke/TIA workup. Head CT without acute abnormality; chronic R frontal infart, atrophy with small vessel chronic ischemic changes. Awaiting MRI. PMH includes TIA (02/2020), HTN, DM2, gout, lumbar stenosis s/p laminectomy.    Clinical Impression  Pt presents with an overall decrease in functional mobility secondary to above. PTA, pt mod indep with SPC, drives, lives with family, working with OP PT for back and balance issues. Today, pt moving fairly well with RW and intermittent min guard for balance. Reports feeling "back to normal" with no visual deficits or weakness noted; pt with L-side trunk lean, but aware of this and able to correct with cues. If to d/c home today, will have adequate support; recommend continued OP PT services. If to remain admitted, will follow acutely to address established goals.     Follow Up Recommendations Outpatient PT;Supervision for mobility/OOB    Equipment Recommendations  None recommended by PT    Recommendations for Other Services       Precautions / Restrictions Precautions Precautions: Fall Restrictions Weight Bearing Restrictions: No      Mobility  Bed Mobility Overal bed mobility: Modified Independent             General bed mobility comments: HOB elevated    Transfers Overall transfer level: Needs assistance Equipment used: Rolling walker (2 wheeled) Transfers: Sit to/from Stand Sit to Stand: Supervision         General transfer comment: Able to stand from EOB and low toilet height to RW with supervision for safety, reliant on UE support  Ambulation/Gait Ambulation/Gait assistance: Min guard Gait Distance  (Feet): 180 Feet Assistive device: Rolling walker (2 wheeled) Gait Pattern/deviations: Step-through pattern;Decreased stride length;Trunk flexed     General Gait Details: Slow, mostly steady gait with RW and intermittent min guard for balance; noted trunk lean towards L-side which pt verbalized umprompted, "I feel like I'm leaning to the left." Able to correct with cues. Cues to maintain closer proximity to RW. 1x self-corrected instability with turns; stability improved on subsequent turns with smaller steps  Stairs            Wheelchair Mobility    Modified Rankin (Stroke Patients Only) Modified Rankin (Stroke Patients Only) Pre-Morbid Rankin Score: No symptoms Modified Rankin: Moderately severe disability     Balance Overall balance assessment: Needs assistance   Sitting balance-Leahy Scale: Good       Standing balance-Leahy Scale: Fair Standing balance comment: Can static stand without UE support; dynamic stability improved with RW                             Pertinent Vitals/Pain Pain Assessment: No/denies pain    Home Living Family/patient expects to be discharged to:: Private residence Living Arrangements: Spouse/significant other;Children Available Help at Discharge: Family;Available 24 hours/day Type of Home: House Home Access: Stairs to enter Entrance Stairs-Rails: Right;Left;Can reach both Entrance Stairs-Number of Steps: 2 Home Layout: Two level;Able to live on main level with bedroom/bathroom Home Equipment: Gilmer Mor - single point;Walker - 2 wheels      Prior Function Level of Independence: Independent with assistive device(s)         Comments:  Mod indep ambulating with SPC. Working with OP PT     Hand Dominance        Extremity/Trunk Assessment   Upper Extremity Assessment Upper Extremity Assessment: Overall WFL for tasks assessed    Lower Extremity Assessment Lower Extremity Assessment: Overall WFL for tasks assessed     Cervical / Trunk Assessment Cervical / Trunk Assessment: Kyphotic;Other exceptions Cervical / Trunk Exceptions: h/o lumbar laminectomy with chronic back "issues" - pt states this is "why I walk like I do" and works with OP PT  Communication      Cognition Arousal/Alertness: Awake/alert Behavior During Therapy: WFL for tasks assessed/performed Overall Cognitive Status: Within Functional Limits for tasks assessed                                 General Comments: WFL for simple tasks, not formally assessed; potentially some decreased insight into deficits and safety awareness      General Comments General comments (skin integrity, edema, etc.): VSS on RA. Vestibular screen negative    Exercises     Assessment/Plan    PT Assessment Patient needs continued PT services  PT Problem List Decreased balance;Decreased mobility       PT Treatment Interventions DME instruction;Gait training;Stair training;Functional mobility training;Therapeutic activities;Therapeutic exercise;Balance training;Patient/family education    PT Goals (Current goals can be found in the Care Plan section)  Acute Rehab PT Goals Patient Stated Goal: Home and continue with OP PT for back and balance PT Goal Formulation: With patient Time For Goal Achievement: 11/02/20 Potential to Achieve Goals: Good    Frequency Min 3X/week   Barriers to discharge        Co-evaluation               AM-PAC PT "6 Clicks" Mobility  Outcome Measure Help needed turning from your back to your side while in a flat bed without using bedrails?: None Help needed moving from lying on your back to sitting on the side of a flat bed without using bedrails?: None Help needed moving to and from a bed to a chair (including a wheelchair)?: A Little Help needed standing up from a chair using your arms (e.g., wheelchair or bedside chair)?: A Little Help needed to walk in hospital room?: A Little Help needed climbing  3-5 steps with a railing? : A Little 6 Click Score: 20    End of Session   Activity Tolerance: Patient tolerated treatment well Patient left: in chair;with call bell/phone within reach;with chair alarm set Nurse Communication: Mobility status PT Visit Diagnosis: Unsteadiness on feet (R26.81);Other abnormalities of gait and mobility (R26.89)    Time: 4332-9518 PT Time Calculation (min) (ACUTE ONLY): 20 min   Charges:   PT Evaluation $PT Eval Moderate Complexity: 1 Mod     Ina Homes, PT, DPT Acute Rehabilitation Services  Pager [phone] Office [phone]  Malachy Chamber 10/19/2020, 11:55 AM

## 2020-10-20 ENCOUNTER — Observation Stay (HOSPITAL_COMMUNITY): Payer: Medicare Other

## 2020-10-20 DIAGNOSIS — R609 Edema, unspecified: Secondary | ICD-10-CM | POA: Diagnosis not present

## 2020-10-20 DIAGNOSIS — G459 Transient cerebral ischemic attack, unspecified: Secondary | ICD-10-CM

## 2020-10-20 DIAGNOSIS — R7989 Other specified abnormal findings of blood chemistry: Secondary | ICD-10-CM | POA: Diagnosis not present

## 2020-10-20 DIAGNOSIS — E039 Hypothyroidism, unspecified: Secondary | ICD-10-CM

## 2020-10-20 LAB — COMPREHENSIVE METABOLIC PANEL
ALT: 16 U/L (ref 0–44)
AST: 33 U/L (ref 15–41)
Albumin: 3.8 g/dL (ref 3.5–5.0)
Alkaline Phosphatase: 32 U/L — ABNORMAL LOW (ref 38–126)
Anion gap: 9 (ref 5–15)
BUN: 14 mg/dL (ref 8–23)
CO2: 30 mmol/L (ref 22–32)
Calcium: 8.6 mg/dL — ABNORMAL LOW (ref 8.9–10.3)
Chloride: 92 mmol/L — ABNORMAL LOW (ref 98–111)
Creatinine, Ser: 1.54 mg/dL — ABNORMAL HIGH (ref 0.61–1.24)
GFR, Estimated: 48 mL/min — ABNORMAL LOW (ref >60)
Glucose, Bld: 85 mg/dL (ref 70–99)
Potassium: 3.8 mmol/L (ref 3.5–5.1)
Sodium: 131 mmol/L — ABNORMAL LOW (ref 135–145)
Total Bilirubin: 0.5 mg/dL (ref 0.3–1.2)
Total Protein: 6.6 g/dL (ref 6.5–8.1)

## 2020-10-20 LAB — HEMOGLOBIN A1C
Hgb A1c MFr Bld: 6.4 % — ABNORMAL HIGH (ref 4.8–5.6)
Mean Plasma Glucose: 136.98 mg/dL

## 2020-10-20 LAB — T4, FREE: Free T4: <0.25 ng/dL — ABNORMAL LOW (ref 0.61–1.12)

## 2020-10-20 LAB — GLUCOSE, CAPILLARY
Glucose-Capillary: 104 mg/dL — ABNORMAL HIGH (ref 70–99)
Glucose-Capillary: 76 mg/dL (ref 70–99)
Glucose-Capillary: 173 mg/dL — ABNORMAL HIGH (ref 70–99)

## 2020-10-20 IMAGING — US US THYROID
1 series · 14 of 25 positions shown · non-contrast
Comparison: CTA neck [DATE]

CLINICAL DATA: Abnormal TSH

EXAM:
THYROID ULTRASOUND
TECHNIQUE: Ultrasound examination of the thyroid gland and adjacent soft
tissues was performed.

[Series 1: us thyroid · 64 acquisitions, 14 frames shown]
[im 1/64]
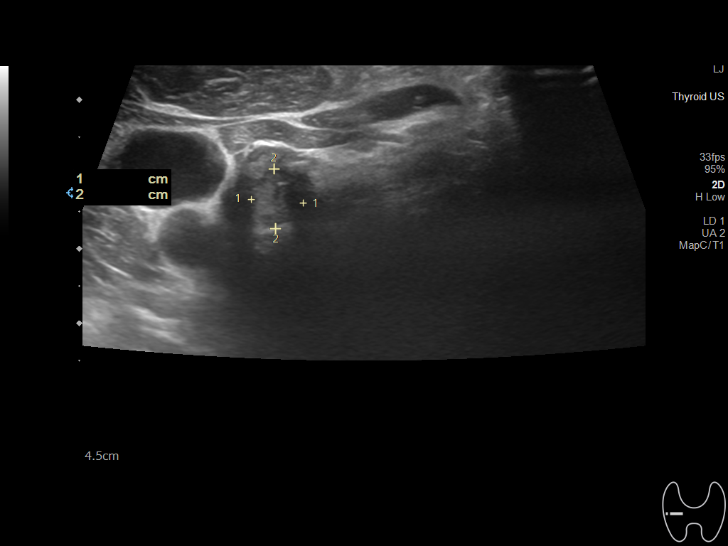
[im 6/64]
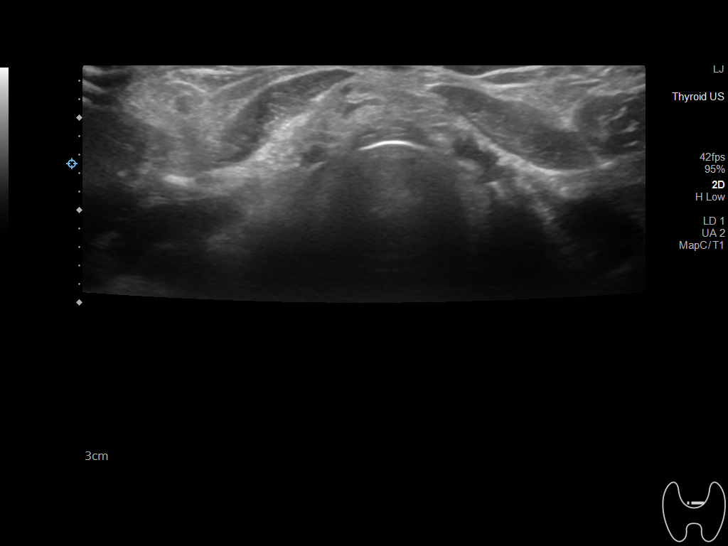
[im 11/64]
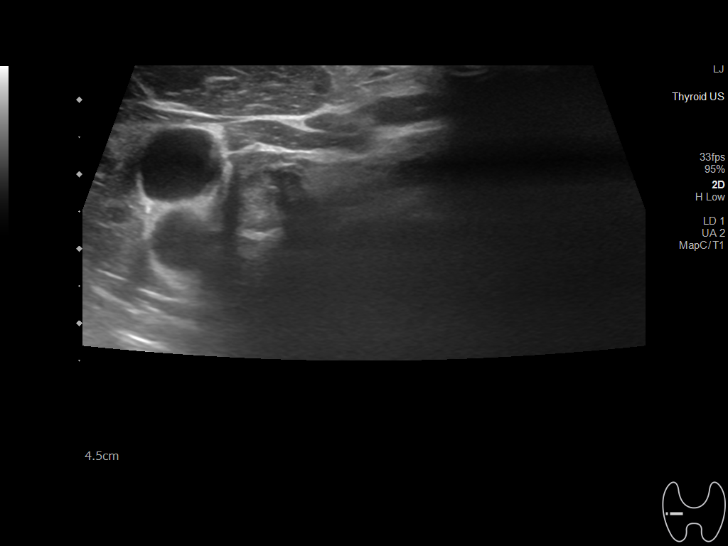
[im 16/64]
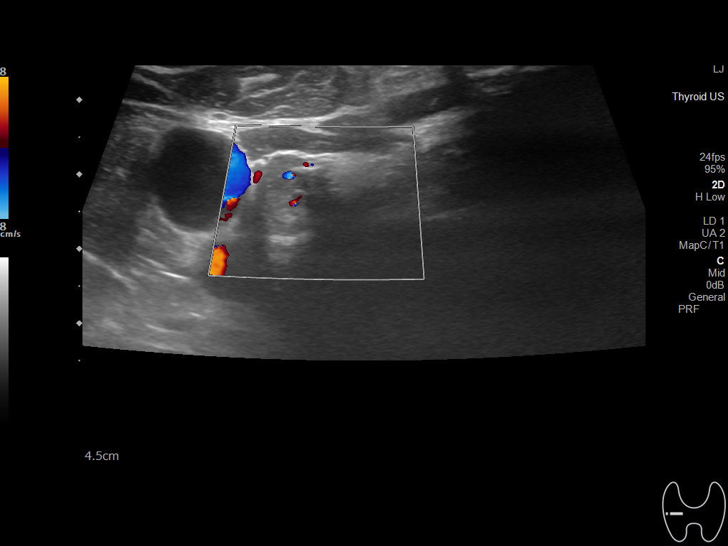
[im 22/64]
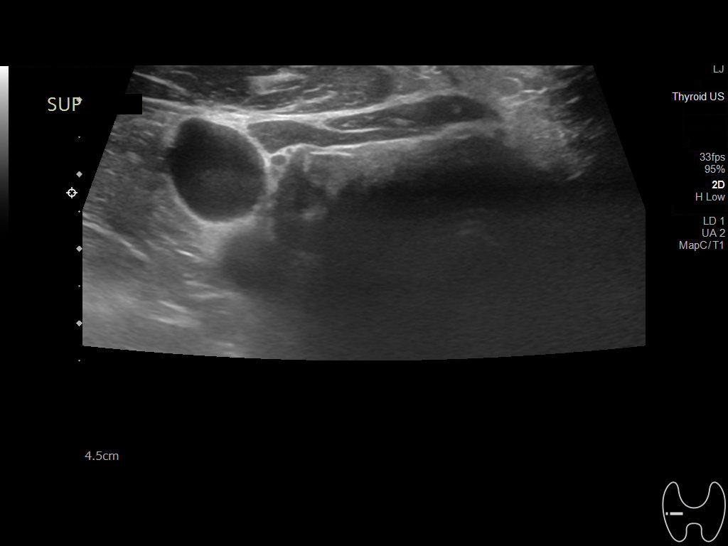
[im 24/64]
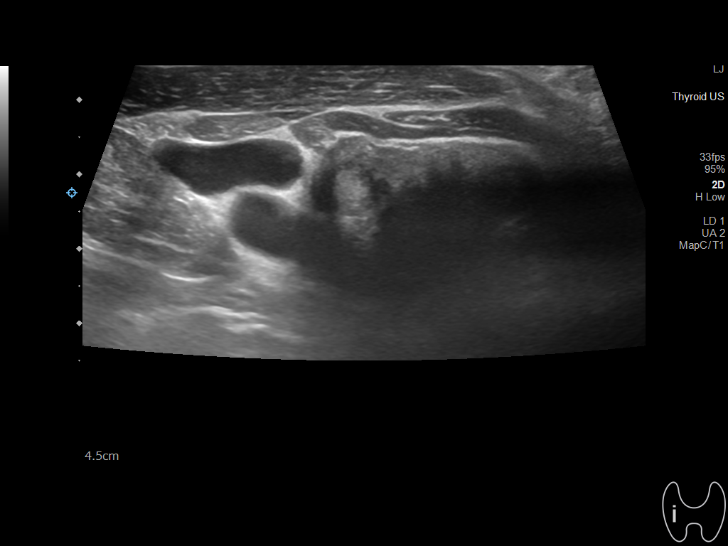
[im 29/64]
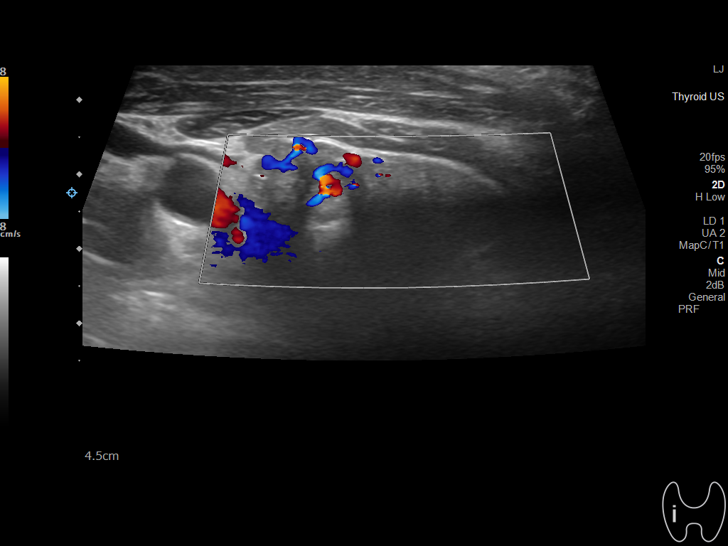
[im 35/64]
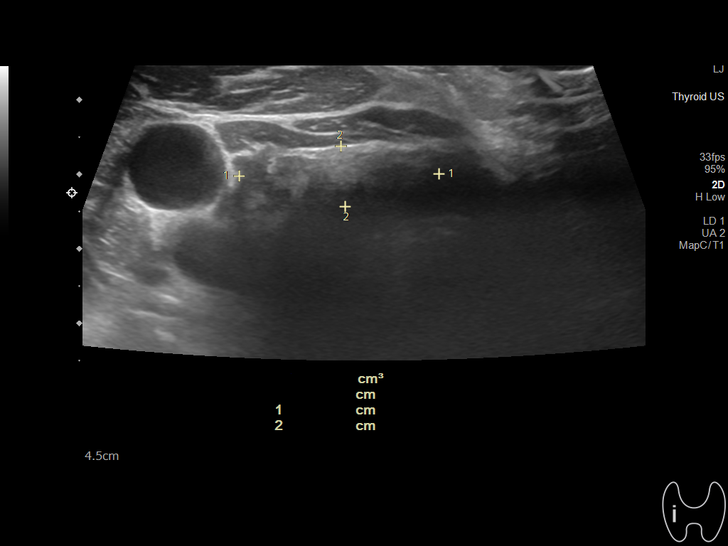
[im 40/64]
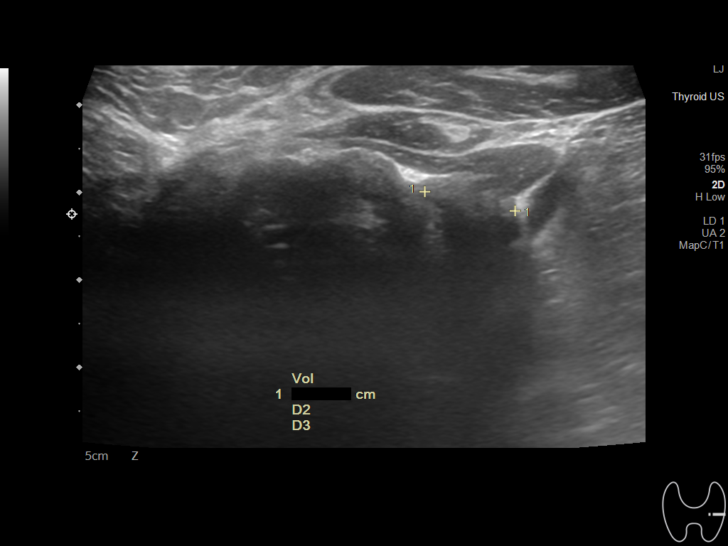
[im 43/64]
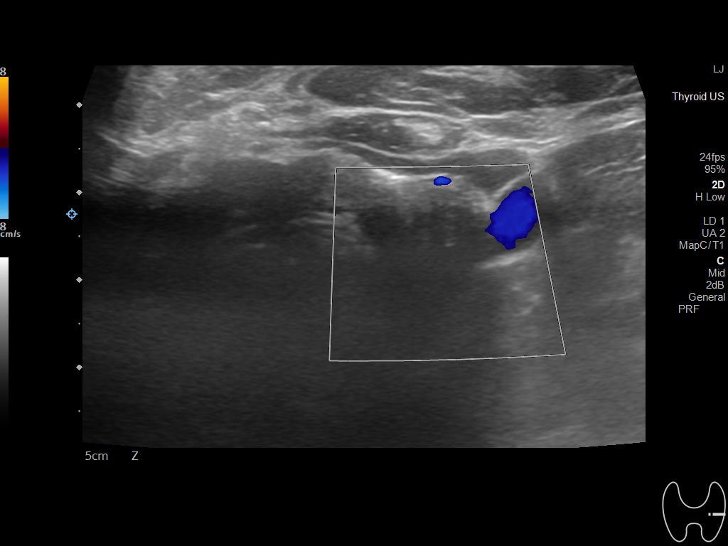
[im 48/64]
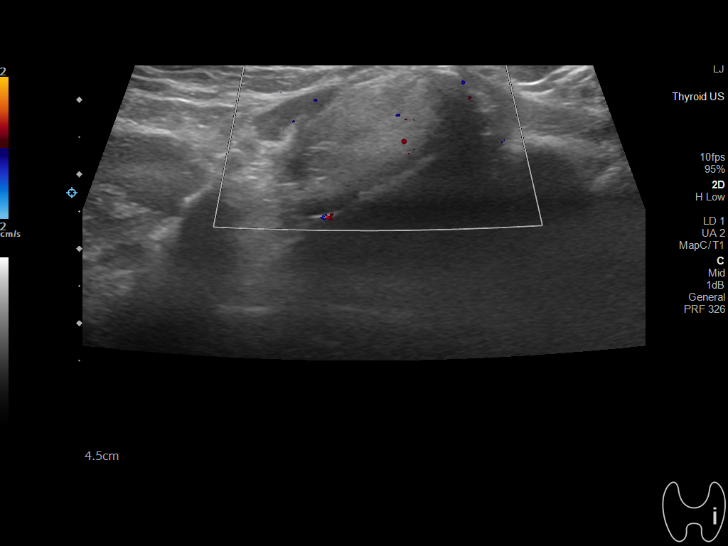
[im 53/64]
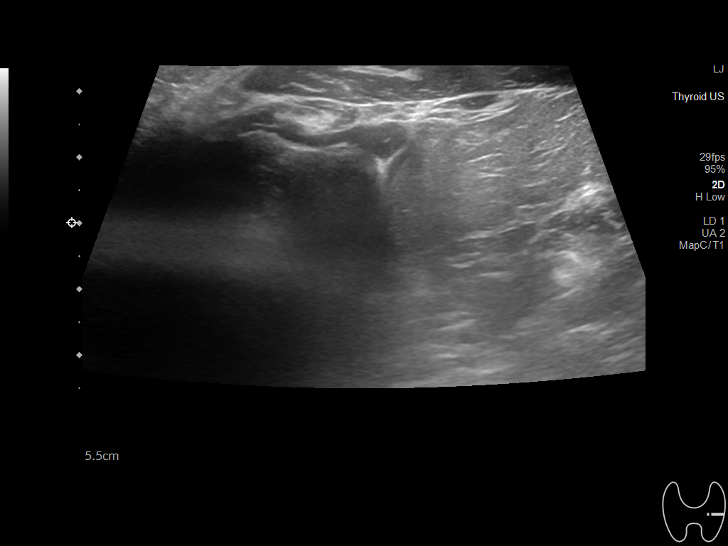
[im 58/64]
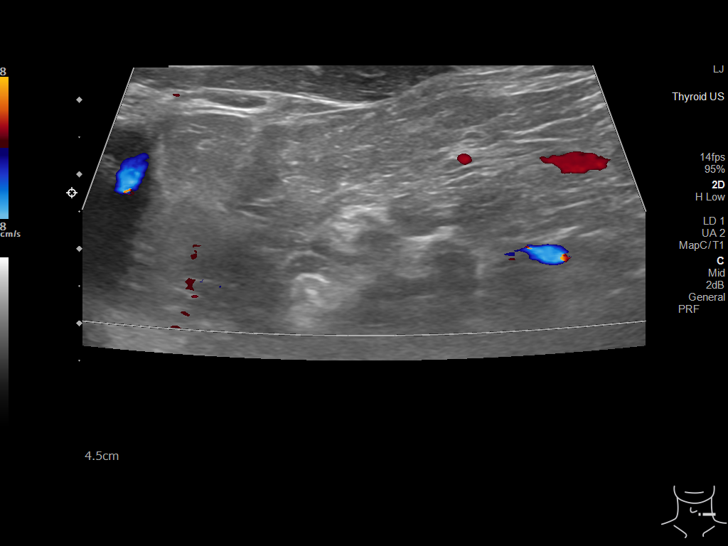
[im 64/64]
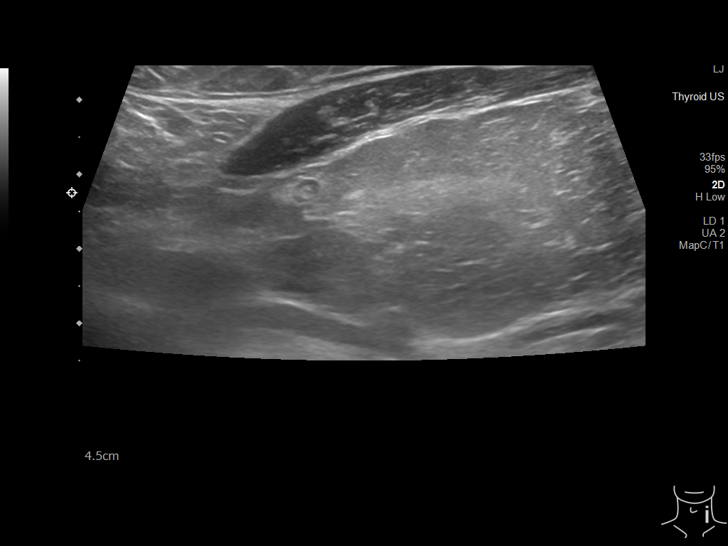

[14 of 25 positions shown; findings below may reference images not displayed]

FINDINGS: Parenchymal Echotexture: Moderately heterogenous

Isthmus: 0.4 cm thickness

Right lobe: 2.7 x 0.8 x 2 cm

Left lobe: 2.3 x 1 x 1.1 cm

_________________________________________________________

Estimated total number of nodules >/= 1 cm: 0

Number of spongiform nodules >/=  2 cm not described below (TR1): 0

Number of mixed cystic and solid nodules >/= 1.5 cm not described
below (TR2): 0

_________________________________________________________

0.8 cm hypoechoic nodule, superior pole right lobe; This nodule does
NOT meet TI-RADS criteria for biopsy or dedicated follow-up. No
regional adenopathy identified.
IMPRESSION: Small thyroid with a single subcentimeter nodule, which does not
meet criteria for biopsy or follow-up.

The above is in keeping with the ACR TI-RADS recommendations - [HOSPITAL] [EY];[DATE].

## 2020-10-20 MED ORDER — LEVOTHYROXINE SODIUM 100 MCG PO TABS
100.0000 ug | ORAL_TABLET | Freq: Every day | ORAL | Status: DC
  Administered 2020-10-20: 08:00:00 100 ug via ORAL
  Filled 2020-10-20: qty 1

## 2020-10-20 MED ORDER — ATORVASTATIN CALCIUM 40 MG PO TABS: 40.0000 mg | ORAL_TABLET | Freq: Every day | ORAL | 2 refills | Status: DC

## 2020-10-20 MED ORDER — ATORVASTATIN CALCIUM 40 MG PO TABS
40.0000 mg | ORAL_TABLET | Freq: Every day | ORAL | Status: DC
  Administered 2020-10-20: 11:00:00 40 mg via ORAL
  Filled 2020-10-20: qty 1

## 2020-10-20 MED ORDER — LEVOTHYROXINE SODIUM 100 MCG PO TABS: 100.0000 ug | ORAL_TABLET | Freq: Every day | ORAL | 2 refills | Status: DC

## 2020-10-20 MED ORDER — METFORMIN HCL ER 500 MG PO TB24: 500.0000 mg | ORAL_TABLET | Freq: Two times a day (BID) | ORAL | 2 refills | Status: DC
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
## 2020-10-20 NOTE — Evaluation (Signed)
Occupational Therapy Evaluation Patient Details Name: Timothy Hester MRN: [id number] DOB: [birth year] Today's Date: 10/20/2020    History of Present Illness Pt is a 69 y.o. male admitted to [facility] 10/18/20 with suddent onset of double vision and L-side weakness, delayed coming to hospital until next day; transfer to [facility] for stroke/TIA workup. Head CT without acute abnormality; chronic R frontal infart, atrophy with small vessel chronic ischemic changes. Awaiting MRI. PMH includes TIA (02/2020), HTN, DM2, gout, lumbar stenosis s/p laminectomy.   Clinical Impression   Patient evaluated by Occupational Therapy with no further acute OT needs identified. All education has been completed and the patient has no further questions. Pt and wife feel he is back to baseline.  He is able to perform ADLs at supervision level with RW.  Discussed options for tub DME and safe tub transfers.  See below for any follow-up Occupational Therapy or equipment needs. OT is signing off. Thank you for this referral.      Follow Up Recommendations  No OT follow up    Equipment Recommendations  None recommended by OT    Recommendations for Other Services       Precautions / Restrictions Precautions Precautions: Fall      Mobility Bed Mobility               General bed mobility comments: sitting EOB    Transfers Overall transfer level: Needs assistance Equipment used: Rolling walker (2 wheeled) Transfers: Sit to/from [employer] Sit to Stand: Supervision Stand pivot transfers: Supervision            Balance Overall balance assessment: Needs assistance Sitting-balance support: Feet supported;No upper extremity supported Sitting balance-Leahy Scale: Good     Standing balance support: No upper extremity supported Standing balance-Leahy Scale: Fair Standing balance comment: able to maintain static standing with supervision                           ADL either  performed or assessed with clinical judgement   ADL Overall ADL's : Needs assistance/impaired Eating/Feeding: Independent   Grooming: Wash/dry hands;Wash/dry face;Oral care;Brushing hair;Supervision/safety;Standing   Upper Body Bathing: Set up;Sitting   Lower Body Bathing: Supervison/ safety;Sit to/from stand Lower Body Bathing Details (indicate cue type and reason): Pt able to simulate shower in standing with supervision and no LOB Upper Body Dressing : Set up;Sitting   Lower Body Dressing: Supervision/safety;Sit to/from stand   Toilet Transfer: Supervision/safety;Ambulation;Comfort height toilet;Regular Toilet;Grab bars;RW   Toileting- Clothing Manipulation and Hygiene: Supervision/safety;Sit to/from stand   Tub/ Shower Transfer: Min guard;Ambulation;Rolling walker Tub/Shower Transfer Details (indicate cue type and reason): Pt reports he holds onto the shower curtain rod when getting into and out of the shower.  Instructed him on safer technique Functional mobility during ADLs: Supervision/safety;Cane       Vision Baseline Vision/History: Wears glasses Wears Glasses: Reading only Vision Assessment?: Yes Eye Alignment: Within Functional Limits Ocular Range of Motion: Within Functional Limits Alignment/Gaze Preference: Within Defined Limits Tracking/Visual Pursuits: Able to track stimulus in all quads without difficulty Convergence: Within functional limits Additional Comments: Pt reports diplopia has resolved     Perception Perception Perception Tested?: Yes   Praxis Praxis Praxis tested?: Within functional limits    Pertinent Vitals/Pain Pain Assessment: No/denies pain     Hand Dominance Right   Extremity/Trunk Assessment Upper Extremity Assessment Upper Extremity Assessment: Defer to OT evaluation   Lower Extremity Assessment Lower Extremity Assessment: Overall WFL for  tasks assessed   Cervical / Trunk Assessment Cervical / Trunk Assessment: Kyphotic;Other  exceptions Cervical / Trunk Exceptions: h/o lumbar laminectomy   Communication Communication Communication: HOH   Cognition Arousal/Alertness: Awake/alert Behavior During Therapy: WFL for tasks assessed/performed Overall Cognitive Status: Within Functional Limits for tasks assessed                                 General Comments: WFL for simple tasks, not formally assessed; potentially some decreased insight into deficits and safety awareness   General Comments  discussed option of tub transfer bench with pt and wife    Exercises     Shoulder Instructions      Home Living Family/patient expects to be discharged to:: Private residence Living Arrangements: Spouse/significant other;Children Available Help at Discharge: Family;Available 24 hours/day Type of Home: House Home Access: Stairs to enter [employer] of Steps: 2 Entrance Stairs-Rails: Right;Left;Can reach both Home Layout: Two level;Able to live on main level with bedroom/bathroom   Alternate Level Stairs-Rails: Right Bathroom Shower/Tub: Tub/shower unit   Bathroom Toilet: Standard Bathroom Accessibility: Yes   Home Equipment: Cane - single point;Walker - 2 wheels          Prior Functioning/Environment Level of Independence: Independent with assistive device(s)        Comments: Mod indep ambulating with SPC. Working with OP PT        OT Problem List: Impaired balance (sitting and/or standing)      OT Treatment/Interventions:      OT Goals(Current goals can be found in the care plan section) Acute Rehab OT Goals Patient Stated Goal: pt eager to discharge home today OT Goal Formulation: All assessment and education complete, DC therapy  OT Frequency:     Barriers to D/C:            Co-evaluation              AM-PAC OT "6 Clicks" Daily Activity     Outcome Measure Help from another person eating meals?: None Help from another person taking care of personal  grooming?: A Little Help from another person toileting, which includes using toliet, bedpan, or urinal?: A Little Help from another person bathing (including washing, rinsing, drying)?: A Little Help from another person to put on and taking off regular upper body clothing?: A Little Help from another person to put on and taking off regular lower body clothing?: A Little 6 Click Score: 19   End of Session Equipment Utilized During Treatment: Rolling walker Nurse Communication: Mobility status  Activity Tolerance: Patient tolerated treatment well Patient left: in bed;with call bell/phone within reach;with family/visitor present  OT Visit Diagnosis: Unsteadiness on feet (R26.81)                Time: 7616-0737 OT Time Calculation (min): 22 min Charges:  OT General Charges $OT Visit: 1 Visit OT Evaluation $OT Eval Moderate Complexity: 1 Mod  Amariana Mirando C., OTR/L Acute Rehabilitation Services Pager [phone] Office [phone]   Jeani Hawking M 10/20/2020, 12:38 PM

## 2020-10-20 NOTE — Discharge Summary (Signed)
Physician Discharge Summary  Timothy Hester Timothy Hester ZOX:[id number] DOB: [birth year] DOA: 10/18/2020  PCP: Joaquim Namuncan, Graham S, MD  Admit date: 10/18/2020 Discharge date: 10/20/2020  Admitted From: Home Disposition: Home Recommendations for Outpatient Follow-up:  1. Follow up with PCP in 1-2 weeks 2. Please obtain BMP/CBC in one week [facility] to follow-up on myasthenia labs and thiamine. Home Health home physical therapy Equipment/Devices: None Discharge Condition stable CODE STATUS: Full code  diet recommendation: Cardiac diet Brief/Interim Summary:69 y.o.malewith medical history significant ofTIA, HTN,IIDM,lumbar stenosis status post laminectomy,gout, presented with sudden onset of double vision and left-sided weakness. CTA showed remote stroke, CT angiogram no major stenosis. ED try to ambulate the patient however very unsteady and tends to fall. [facility] for [facility] as part of TIA work up.   Discharge Diagnoses:  Active Problems:   TIA (transient ischemic attack)   Swelling  #1 transient diplopia with gait disturbance leaning to the left-MRI of the brain no acute abnormality moderate cerebral white matter signal changes suggestive of chronic small vessel disease. Labs for myasthenia gravis has been sent results are pending at the time of discharge she will follow-up with neurology to discuss these results. Patient will receive outpatient physical therapy.  #2 severe hypothyroidism with Timothy TSH of 132 started on Synthroid 100 MCG daily check thyroid panel in 4 weeks discussed with his wife  #3 tobacco abuse encouraged cessation  #4 type 2 diabetes hemoglobin A1c 6.3 increase Metformin to 500 mg twice Timothy day  #5 hyperlipidemia LDL is elevated he was already on 20 mg of Lipitor this dose has been increased to 40 mg daily.  #6 history of essential hypertension restarted ACE inhibitor. Norvasc on hold at the time of discharge due to soft blood  pressure.  #7 obesity encourage diet exercise  #8 possible sleep apnea follow-up with PCP for possible sleep study  #9 left lower extremity swelling wife reports his swelling has increased since he had back surgery in August 2021. Venous Doppler showed no evidence of DVT however on the left leg there was Timothy prominent popliteal cyst.   Estimated body mass index is 32.68 kg/m as calculated from the following:   Height as of this encounter: 5\' 8"  (1.727 m).   Weight as of this encounter: 97.5 kg.  Discharge Instructions  Discharge Instructions    Diet - low sodium heart healthy   Complete by: As directed    Increase activity slowly   Complete by: As directed      Allergies as of 10/20/2020      Reactions   Erythromycin Rash      Medication List    STOP taking these medications   amLODipine 10 MG tablet Commonly known as: NORVASC   ondansetron 4 MG tablet Commonly known as: Zofran   OVER THE COUNTER MEDICATION     TAKE these medications   allopurinol 300 MG tablet Commonly known as: ZYLOPRIM Take 300 mg by mouth daily.   aspirin EC 81 MG tablet Take 81 mg by mouth daily.   atorvastatin 40 MG tablet Commonly known as: LIPITOR Take 1 tablet (40 mg total) by mouth daily. Start taking on: October 21, 2020 What changed:   medication strength  how much to take   levothyroxine 100 MCG tablet Commonly known as: SYNTHROID Take 1 tablet (100 mcg total) by mouth daily at 6 (six) AM. Start taking on: October 21, 2020   losartan 50 MG tablet Commonly known as: COZAAR  Take 100 mg by mouth daily. .   meloxicam 7.5 MG tablet Commonly known as: MOBIC Take 1 tablet (7.5 mg total) by mouth 2 (two) times daily as needed for pain (with food.).   metFORMIN 500 MG 24 hr tablet Commonly known as: GLUCOPHAGE-XR Take 1 tablet (500 mg total) by mouth 2 (two) times daily. What changed: when to take this       Follow-up Information    Joaquim Nam, MD Follow up.    Specialty: Family Medicine Why: [facility] THYROID PROFILE  IN 4 WEEKS Contact information: [address] [place] [address] [phone]        [facility] Follow up.   Why: Please follow-up on the blood tests that were pending at the time of discharge including myasthenia studies. Contact information: [address]     [address] [place] [address] [phone]             Allergies  Allergen Reactions  . Erythromycin Rash    Consultations:  Neurology   Procedures/Studies: CT Angio Head W or Wo Contrast  Result Date: 10/18/2020 CLINICAL DATA:  Left-sided weakness, numbness, and facial droop. Diplopia. EXAM: CT ANGIOGRAPHY HEAD AND NECK TECHNIQUE: Multidetector CT imaging of the head and neck was performed using the standard protocol during bolus administration of intravenous contrast. Multiplanar CT image reconstructions and MIPs were obtained to evaluate the vascular anatomy. Carotid stenosis measurements (when applicable) are obtained utilizing NASCET criteria, using the distal internal carotid diameter as the denominator. CONTRAST:  40mL OMNIPAQUE IOHEXOL 350 MG/ML SOLN COMPARISON:  Head MRA 03/14/2020. Carotid Doppler ultrasound 03/14/2020. FINDINGS: CTA NECK FINDINGS Aortic arch: Standard 3 vessel aortic arch with moderate calcified plaque. No significant arch vessel origin stenosis. Right carotid system: Patent with mild-to-moderate calcified and soft plaque at the carotid bifurcation. No evidence of significant stenosis or dissection. Tortuous distal cervical ICA. Left carotid system: Patent with moderate calcified and soft plaque at the carotid bifurcation resulting in severe stenosis of the ECA origin. No evidence of Timothy significant common or internal carotid artery stenosis. Vertebral arteries: Patent and codominant without evidence of stenosis or dissection. Skeleton: Moderate lower cervical disc degeneration.  Advanced multilevel cervical facet arthrosis. Other neck: Punctate calcifications in the left parotid gland. 9 mm right thyroid nodule for which no imaging follow-up is recommended. Upper chest: Clear lung apices. Review of the MIP images confirms the above findings CTA HEAD FINDINGS Anterior circulation: The internal carotid arteries are patent from skull base to carotid termini with calcified plaque resulting in mild proximal supraclinoid stenosis bilaterally. ACAs and MCAs are patent without evidence of Timothy proximal branch occlusion or significant proximal stenosis. No aneurysm is identified. Posterior circulation: The intracranial vertebral arteries are patent to the basilar with atherosclerosis resulting in mild stenosis bilaterally. Patent PICA and SCA origins are seen bilaterally. The basilar artery is patent with mild atherosclerotic plaque bilaterally not resulting in Timothy significant stenosis. There is Timothy moderately large right posterior communicating artery. The PCAs are patent with atherosclerotic irregularity bilaterally as well as moderate bilateral P2 stenoses. No aneurysm is identified. Venous sinuses: Patent. Anatomic variants: None. Review of the MIP images confirms the above findings IMPRESSION: 1. No large vessel occlusion. 2. Intracranial atherosclerosis with mild bilateral ICA, mild bilateral vertebral artery, and moderate bilateral P2 stenoses. 3. Cervical carotid atherosclerosis without significant common carotid or cervical internal carotid artery stenosis. Severe left ECA origin stenosis. 4. Aortic Atherosclerosis (ICD10-I70.0). Electronically Signed  By: Sebastian Ache M.D.   On: 10/18/2020 18:20   DG Chest 2 View  Result Date: 10/18/2020 CLINICAL DATA:  Left-sided weakness, left-sided facial droop EXAM: CHEST - 2 VIEW COMPARISON:  04/05/2020 FINDINGS: The heart size and mediastinal contours are within normal limits. Both lungs are clear. The visualized skeletal structures are unremarkable.  IMPRESSION: No active cardiopulmonary disease. Electronically Signed   By: Sharlet Salina M.D.   On: 10/18/2020 20:10   CT Head Wo Contrast  Result Date: 10/18/2020 CLINICAL DATA:  Dizziness, LEFT-sided weakness, LEFT facial droop with LEFT side numbness, double vision, history hypertension EXAM: CT HEAD WITHOUT CONTRAST TECHNIQUE: Contiguous axial images were obtained from the base of the skull through the vertex without intravenous contrast. Sagittal and coronal MPR images reconstructed from axial data set. COMPARISON:  03/13/2020 FINDINGS: Brain: Generalized atrophy. Normal ventricular morphology. No midline shift or mass effect. Small vessel chronic ischemic changes of deep cerebral white matter. Small RIGHT frontal white matter infarct. No intracranial hemorrhage, mass lesion, evidence of acute infarction, or extra-axial fluid collection. Vascular: No hyperdense vessels. Atherosclerotic calcification of internal carotid and vertebral arteries at skull base Skull: Intact Sinuses/Orbits: Clear Other: N/Timothy IMPRESSION: Atrophy with small vessel chronic ischemic changes of deep cerebral white matter. 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 No acute intracranial abnormalities. Electronically Signed   By: Ulyses Southward M.D.   On: 10/18/2020 16:02   CT Angio Neck W and/or Wo Contrast  Result Date: 10/18/2020 CLINICAL DATA:  Left-sided weakness, numbness, and facial droop. Diplopia. EXAM: CT ANGIOGRAPHY HEAD AND NECK TECHNIQUE: Multidetector CT imaging of the head and neck was performed using the standard protocol during bolus administration of intravenous contrast. Multiplanar CT image reconstructions and MIPs were obtained to evaluate the vascular anatomy. Carotid stenosis measurements (when applicable) are obtained utilizing NASCET criteria, using the distal internal carotid diameter as the denominator. CONTRAST:  75mL OMNIPAQUE IOHEXOL 350 MG/ML SOLN COMPARISON:  Head MRA 03/14/2020. Carotid Doppler  ultrasound 03/14/2020. FINDINGS: CTA NECK FINDINGS Aortic arch: Standard 3 vessel aortic arch with moderate calcified plaque. No significant arch vessel origin stenosis. Right carotid system: Patent with mild-to-moderate calcified and soft plaque at the carotid bifurcation. No evidence of significant stenosis or dissection. Tortuous distal cervical ICA. Left carotid system: Patent with moderate calcified and soft plaque at the carotid bifurcation resulting in severe stenosis of the ECA origin. No evidence of Timothy significant common or internal carotid artery stenosis. Vertebral arteries: Patent and codominant without evidence of stenosis or dissection. Skeleton: Moderate lower cervical disc degeneration. Advanced multilevel cervical facet arthrosis. Other neck: Punctate calcifications in the left parotid gland. 9 mm right thyroid nodule for which no imaging follow-up is recommended. Upper chest: Clear lung apices. Review of the MIP images confirms the above findings CTA HEAD FINDINGS Anterior circulation: The internal carotid arteries are patent from skull base to carotid termini with calcified plaque resulting in mild proximal supraclinoid stenosis bilaterally. ACAs and MCAs are patent without evidence of Timothy proximal branch occlusion or significant proximal stenosis. No aneurysm is identified. Posterior circulation: The intracranial vertebral arteries are patent to the basilar with atherosclerosis resulting in mild stenosis bilaterally. Patent PICA and SCA origins are seen bilaterally. The basilar artery is patent with mild atherosclerotic plaque bilaterally not resulting in Timothy significant stenosis. There is Timothy moderately large right posterior communicating artery. The PCAs are patent with atherosclerotic irregularity bilaterally as well as moderate bilateral P2 stenoses. No aneurysm is identified. Venous sinuses: Patent. Anatomic variants: None.  Review of the MIP images confirms the above findings IMPRESSION: 1. No  large vessel occlusion. 2. Intracranial atherosclerosis with mild bilateral ICA, mild bilateral vertebral artery, and moderate bilateral P2 stenoses. 3. Cervical carotid atherosclerosis without significant common carotid or cervical internal carotid artery stenosis. Severe left ECA origin stenosis. 4. Aortic Atherosclerosis (ICD10-I70.0). Electronically Signed   By: Sebastian Ache M.D.   On: 10/18/2020 18:20   MR BRAIN WO CONTRAST  Result Date: 10/19/2020 CLINICAL DATA:  69 year old male TIA. Left side weakness, numbness, facial droop, diplopia. EXAM: MRI HEAD WITHOUT CONTRAST TECHNIQUE: Multiplanar, multiecho pulse sequences of the brain and surrounding structures were obtained without intravenous contrast. COMPARISON:  CTA head and neck and head CT yesterday. Brain MRI 03/13/2020. FINDINGS: Brain: No restricted diffusion to suggest acute infarction. No midline shift, mass effect, evidence of mass lesion, ventriculomegaly, extra-axial collection or acute intracranial hemorrhage. Cervicomedullary junction and pituitary are within normal limits. Widely scattered bilateral cerebral white matter T2 and FLAIR hyperintensity, with some areas most resembling chronic white matter lacunar infarcts (series 10, image 15). Relative sparing of the deep gray nuclei as before. No cortical encephalomalacia or chronic cerebral blood products identified. Brainstem and cerebellum are within normal limits. Vascular: Major intracranial vascular flow voids are stable since May. Mild generalized arterial tortuosity. Skull and upper cervical spine: Normal for age. Sinuses/Orbits: Negative orbits. Chronic right maxillary sinus mucous retention cyst and mild bilateral mucosal thickening. Other: Trace right mastoid fluid is stable. Negative nasopharynx. Grossly normal other visible internal auditory structures. Negative visible scalp and face. IMPRESSION: No acute intracranial abnormality with stable noncontrast MRI appearance of the  brain since May. Moderate cerebral white matter signal changes most suggestive of chronic small vessel disease. Electronically Signed   By: Odessa Fleming M.D.   On: 10/19/2020 20:41   ECHOCARDIOGRAM COMPLETE  Result Date: 10/19/2020    ECHOCARDIOGRAM REPORT   Patient Name:   MOHAMEDAMIN NIFONG Date of Exam: 10/19/2020 Medical Rec #:  [id number]       Height:       68.0 in Accession #:    [id number]      Weight:       214.9 lb Date of Birth:  November 13, 1950      BSA:          2.107 m Patient Age:    69 years        BP:           122/89 mmHg Patient Gender: M               HR:           92 bpm. Exam Location:  Inpatient Procedure: 2D Echo, Cardiac Doppler and Color Doppler Indications:    TIA  History:        Patient has prior history of Echocardiogram examinations, most                 recent 03/14/2020. Risk Factors:Hypertension, Current Smoker and                 Dyslipidemia.  Sonographer:    Sheralyn Boatman RDCS Referring Phys: [id number] Gardiner Ramus Abigial [place]  Sonographer Comments: Technically difficult study due to poor echo windows. IMPRESSIONS  1. Left ventricular ejection fraction, by estimation, is 60 to 65%. The left ventricle has normal function. The left ventricle has no regional wall motion abnormalities. There is mild concentric left ventricular hypertrophy. Left ventricular diastolic parameters are consistent with Grade I diastolic dysfunction (impaired  relaxation).  2. Right ventricular systolic function is normal. The right ventricular size is normal. Tricuspid regurgitation signal is inadequate for assessing PA pressure.  3. The mitral valve is grossly normal. No evidence of mitral valve regurgitation. No evidence of mitral stenosis.  4. The aortic valve is tricuspid. There is mild calcification of the aortic valve. There is mild thickening of the aortic valve. Aortic valve regurgitation is not visualized. Mild aortic valve sclerosis is present, with no evidence of aortic valve stenosis.  5. The inferior vena cava is  normal in size with greater than 50% respiratory variability, suggesting right atrial pressure of 3 mmHg. Conclusion(s)/Recommendation(s): No intracardiac source of embolism detected on this transthoracic study. Timothy transesophageal echocardiogram is recommended to exclude cardiac source of embolism if clinically indicated. FINDINGS  Left Ventricle: Left ventricular ejection fraction, by estimation, is 60 to 65%. The left ventricle has normal function. The left ventricle has no regional wall motion abnormalities. The left ventricular internal cavity size was normal in size. There is  mild concentric left ventricular hypertrophy. Left ventricular diastolic parameters are consistent with Grade I diastolic dysfunction (impaired relaxation). Right Ventricle: The right ventricular size is normal. No increase in right ventricular wall thickness. Right ventricular systolic function is normal. Tricuspid regurgitation signal is inadequate for assessing PA pressure. Left Atrium: Left atrial size was normal in size. Right Atrium: Right atrial size was normal in size. Pericardium: Trivial pericardial effusion is present. Presence of pericardial fat pad. Mitral Valve: The mitral valve is grossly normal. No evidence of mitral valve regurgitation. No evidence of mitral valve stenosis. Tricuspid Valve: The tricuspid valve is grossly normal. Tricuspid valve regurgitation is trivial. No evidence of tricuspid stenosis. Aortic Valve: The aortic valve is tricuspid. There is mild calcification of the aortic valve. There is mild thickening of the aortic valve. Aortic valve regurgitation is not visualized. Mild aortic valve sclerosis is present, with no evidence of aortic valve stenosis. Pulmonic Valve: The pulmonic valve was grossly normal. Pulmonic valve regurgitation is not visualized. No evidence of pulmonic stenosis. Aorta: The aortic root and ascending aorta are structurally normal, with no evidence of dilitation. Venous: The inferior  vena cava is normal in size with greater than 50% respiratory variability, suggesting right atrial pressure of 3 mmHg. IAS/Shunts: The atrial septum is grossly normal.  LEFT VENTRICLE PLAX 2D LVIDd:         3.10 cm     Diastology LVIDs:         2.10 cm     LV e' medial:    6.20 cm/s LV PW:         1.65 cm     LV E/e' medial:  12.4 LV IVS:        1.65 cm     LV e' lateral:   4.24 cm/s LVOT diam:     1.80 cm     LV E/e' lateral: 18.2 LV SV:         42 LV SV Index:   20 LVOT Area:     2.54 cm  LV Volumes (MOD) LV vol d, MOD A2C: 52.3 ml LV vol d, MOD A4C: 59.0 ml LV vol s, MOD A2C: 16.0 ml LV vol s, MOD A4C: 22.7 ml LV SV MOD A2C:     36.3 ml LV SV MOD A4C:     59.0 ml LV SV MOD BP:      38.3 ml RIGHT VENTRICLE  IVC RV S prime:     8.49 cm/s  IVC diam: 1.70 cm TAPSE (M-mode): 1.2 cm LEFT ATRIUM             Index       RIGHT ATRIUM           Index LA diam:        2.40 cm 1.14 cm/m  RA Area:     15.70 cm LA Vol (A2C):   31.4 ml 14.90 ml/m RA Volume:   39.40 ml  18.70 ml/m LA Vol (A4C):   36.4 ml 17.27 ml/m LA Biplane Vol: 34.4 ml 16.32 ml/m  AORTIC VALVE LVOT Vmax:   89.30 cm/s LVOT Vmean:  62.700 cm/s LVOT VTI:    0.167 m  AORTA Ao Root diam: 3.20 cm Ao Asc diam:  3.40 cm MITRAL VALVE MV Area (PHT): 3.99 cm    SHUNTS MV Decel Time: 190 msec    Systemic VTI:  0.17 m MV E velocity: 77.10 cm/s  Systemic Diam: 1.80 cm MV Timothy velocity: 86.50 cm/s MV E/Timothy ratio:  0.89 Lennie Odor MD Electronically signed by Lennie Odor MD Signature Date/Time: 10/19/2020/4:50:43 PM    Final    US THYROID  Result Date: 10/20/2020 CLINICAL DATA:  Abnormal TSH EXAM: THYROID ULTRASOUND TECHNIQUE: Ultrasound examination of the thyroid gland and adjacent soft tissues was performed. COMPARISON:  CTA neck 10/18/2020 FINDINGS: Parenchymal Echotexture: Moderately heterogenous Isthmus: 0.4 cm thickness Right lobe: 2.7 x 0.8 x 2 cm Left lobe: 2.3 x 1 x 1.1 cm _________________________________________________________ Estimated total  number of nodules >/= 1 cm: 0 Number of spongiform nodules >/=  2 cm not described below (TR1): 0 Number of mixed cystic and solid nodules >/= 1.5 cm not described below (TR2): 0 _________________________________________________________ 0.8 cm hypoechoic nodule, superior pole right lobe; This nodule does NOT meet TI-RADS criteria for biopsy or dedicated follow-up. No regional adenopathy identified. IMPRESSION: Small thyroid with Timothy single subcentimeter nodule, which does not meet criteria for biopsy or follow-up. The above is in keeping with the ACR TI-RADS recommendations - J Am Coll Radiol 2017;14:587-595. Electronically Signed   By: Corlis Leak M.D.   On: 10/20/2020 10:40   VAS [place] LOWER EXTREMITY VENOUS (DVT)  Result Date: 10/20/2020  Lower Venous DVT Study Indications: Edema.  Risk Factors: None identified. Limitations: Poor ultrasound/tissue interface. Comparison Study: No prior studies. Performing Technologist: Chanda Busing RVT  Examination Guidelines: Timothy complete evaluation includes B-mode imaging, spectral Doppler, color Doppler, and power Doppler as needed of all accessible portions of each vessel. Bilateral testing is considered an integral part of Timothy complete examination. Limited examinations for reoccurring indications may be performed as noted. The reflux portion of the exam is performed with the patient in reverse Trendelenburg.  +-----+---------------+---------+-----------+----------+--------------+ RIGHTCompressibilityPhasicitySpontaneityPropertiesThrombus Aging +-----+---------------+---------+-----------+----------+--------------+ CFV  Full           Yes      Yes                                 +-----+---------------+---------+-----------+----------+--------------+   +---------+---------------+---------+-----------+----------+--------------+ LEFT     CompressibilityPhasicitySpontaneityPropertiesThrombus Aging  +---------+---------------+---------+-----------+----------+--------------+ CFV      Full           Yes      Yes                                 +---------+---------------+---------+-----------+----------+--------------+  SFJ      Full                                                        +---------+---------------+---------+-----------+----------+--------------+ FV Prox  Full                                                        +---------+---------------+---------+-----------+----------+--------------+ FV Mid   Full                                                        +---------+---------------+---------+-----------+----------+--------------+ FV DistalFull                                                        +---------+---------------+---------+-----------+----------+--------------+ PFV      Full                                                        +---------+---------------+---------+-----------+----------+--------------+ POP      Full           Yes      Yes                                 +---------+---------------+---------+-----------+----------+--------------+ PTV      Full                                                        +---------+---------------+---------+-----------+----------+--------------+ PERO     Full                                                        +---------+---------------+---------+-----------+----------+--------------+     Summary: RIGHT: - No evidence of common femoral vein obstruction.  LEFT: - There is no evidence of deep vein thrombosis in the lower extremity.  - Timothy cystic structure is found in the popliteal fossa.  *See table(s) above for measurements and observations. Electronically signed by Lemar Livings MD on 10/20/2020 at 10:38:35 AM.    Final     (Echo, Carotid, EGD, Colonoscopy, ERCP)    Subjective:  Patient is resting in bed in no acute distress Anxious to go home he feels he is back to his  baseline Discharge Exam: Vitals:   10/20/20 0750 10/20/20 1149  BP: 128/86 118/84  Pulse: 72 73  Resp: 15 18  Temp: 98 F (36.7 C) 97.6 F (36.4 C)  SpO2: 96% 92%   Vitals:   10/19/20 2348 10/20/20 0445 10/20/20 0750 10/20/20 1149  BP: 103/75 125/84 128/86 118/84  Pulse: 61 67 72 73  Resp: 14 15 15 18   Temp: (!) 97.3 F (36.3 C) 97.9 F (36.6 C) 98 F (36.7 C) 97.6 F (36.4 C)  TempSrc: Oral  Oral Oral  SpO2: 96% 97% 96% 92%  Weight:      Height:        General: Pt is alert, awake, not in acute distress Cardiovascular: RRR, S1/S2 +, no rubs, no gallops Respiratory: CTA bilaterally, no wheezing, no rhonchi Abdominal: Soft, NT, ND, bowel sounds + Extremities: no edema, no cyanosis    The results of significant diagnostics from this hospitalization (including imaging, microbiology, ancillary and laboratory) are listed below for reference.     Microbiology: Recent Results (from the past 240 hour(s))  Resp Panel by RT-PCR (Flu Timothy&B, Covid) Nasopharyngeal Swab     Status: None   Collection Time: 10/18/20  7:25 PM   Specimen: Nasopharyngeal Swab; Nasopharyngeal(NP) swabs in vial transport medium  Result Value Ref Range Status   SARS Coronavirus 2 by RT PCR NEGATIVE NEGATIVE Final    Comment: (NOTE) SARS-CoV-2 target nucleic acids are NOT DETECTED.  The SARS-CoV-2 RNA is generally detectable in upper respiratory specimens during the acute phase of infection. The lowest concentration of SARS-CoV-2 viral copies this assay can detect is 138 copies/mL. Timothy negative result does not preclude SARS-Cov-2 infection and should not be used as the sole basis for treatment or other patient management decisions. Timothy negative result may occur with  improper specimen collection/handling, submission of specimen other than nasopharyngeal swab, presence of viral mutation(s) within the areas targeted by this assay, and inadequate number of viral copies(<138 copies/mL). Timothy negative result must  be combined with clinical observations, patient history, and epidemiological information. The expected result is Negative.  Fact Sheet for Patients:  BloggerCourse.com  Fact Sheet for Healthcare Providers:  SeriousBroker.it  This test is no t yet approved or cleared by the [place] FDA and  has been authorized for detection and/or diagnosis of SARS-CoV-2 by FDA under an Emergency Use Authorization (EUA). This EUA will remain  in effect (meaning this test can be used) for the duration of the COVID-19 declaration under Section 564(b)(1) of the Act, 21 U.S.C.section 360bbb-3(b)(1), unless the authorization is terminated  or revoked sooner.       Influenza Timothy by PCR NEGATIVE NEGATIVE Final   Influenza B by PCR NEGATIVE NEGATIVE Final    Comment: (NOTE) The Xpert Xpress SARS-CoV-2/FLU/RSV plus assay is intended as an aid in the diagnosis of influenza from Nasopharyngeal swab specimens and should not be used as Timothy sole basis for treatment. Nasal washings and aspirates are unacceptable for Xpert Xpress SARS-CoV-2/FLU/RSV testing.  Fact Sheet for Patients: BloggerCourse.com  Fact Sheet for Healthcare Providers: SeriousBroker.it  This test is not yet approved or cleared by the [place] FDA and has been authorized for detection and/or diagnosis of SARS-CoV-2 by FDA under an Emergency Use Authorization (EUA). This EUA will remain in effect (meaning this test can be used) for the duration of the COVID-19 declaration under Section 564(b)(1) of the Act, 21 U.S.C. section 360bbb-3(b)(1), unless the authorization is terminated or revoked.  Performed at [facility], [address], [place], [place] [address]      Labs: BNP (last 3  results) No results for input(s): BNP in the last 8760 hours. Basic Metabolic Panel: Recent Labs  Lab 10/18/20 1522 10/19/20 0631 10/20/20 0224   NA 129* 129* 131*  K 4.0 3.8 3.8  CL 91* 90* 92*  CO2 29 29 30   GLUCOSE 87 94 85  BUN 16 16 14   CREATININE 1.39* 1.61* 1.54*  CALCIUM 9.0 8.7* 8.6*   Liver Function Tests: Recent Labs  Lab 10/18/20 1522 10/20/20 0224  AST 25 33  ALT 15 16  ALKPHOS 38 32*  BILITOT 0.6 0.5  PROT 8.4* 6.6  ALBUMIN 4.9 3.8   No results for input(s): LIPASE, AMYLASE in the last 168 hours. No results for input(s): AMMONIA in the last 168 hours. CBC: Recent Labs  Lab 10/18/20 1522  WBC 6.9  NEUTROABS 4.1  HGB 14.5  HCT 43.8  MCV 98.4  PLT 206   Cardiac Enzymes: No results for input(s): CKTOTAL, CKMB, CKMBINDEX, TROPONINI in the last 168 hours. BNP: Invalid input(s): POCBNP CBG: Recent Labs  Lab 10/19/20 1140 10/19/20 1616 10/19/20 2147 10/20/20 0636 10/20/20 0700  GLUCAP 99 111* 82 76 104*   D-Dimer No results for input(s): DDIMER in the last 72 hours. Hgb A1c Recent Labs    10/19/20 0631 10/20/20 0224  HGBA1C 6.4* 6.4*   Lipid Profile Recent Labs    10/19/20 0631  CHOL 209*  HDL 41  LDLCALC 137*  TRIG 154*  CHOLHDL 5.1   Thyroid function studies Recent Labs    10/19/20 0631  TSH 132.795*   Anemia work up No results for input(s): VITAMINB12, FOLATE, FERRITIN, TIBC, IRON, RETICCTPCT in the last 72 hours. Urinalysis    Component Value Date/Time   COLORURINE STRAW (Timothy) 10/18/2020 1645   APPEARANCEUR CLEAR 10/18/2020 1645   APPEARANCEUR Hazy 05/25/2012 0908   LABSPEC 1.010 10/18/2020 1645   LABSPEC 1.026 05/25/2012 0908   PHURINE 7.0 10/18/2020 1645   GLUCOSEU NEGATIVE 10/18/2020 1645   GLUCOSEU Negative 05/25/2012 0908   HGBUR NEGATIVE 10/18/2020 1645   BILIRUBINUR NEGATIVE 10/18/2020 1645   BILIRUBINUR Negative 05/25/2012 0908   KETONESUR NEGATIVE 10/18/2020 1645   PROTEINUR NEGATIVE 10/18/2020 1645   NITRITE NEGATIVE 10/18/2020 1645   LEUKOCYTESUR NEGATIVE 10/18/2020 1645   LEUKOCYTESUR Negative 05/25/2012 0908   Sepsis Labs Invalid input(s):  PROCALCITONIN,  WBC,  LACTICIDVEN Microbiology Recent Results (from the past 240 hour(s))  Resp Panel by RT-PCR (Flu Timothy&B, Covid) Nasopharyngeal Swab     Status: None   Collection Time: 10/18/20  7:25 PM   Specimen: Nasopharyngeal Swab; Nasopharyngeal(NP) swabs in vial transport medium  Result Value Ref Range Status   SARS Coronavirus 2 by RT PCR NEGATIVE NEGATIVE Final    Comment: (NOTE) SARS-CoV-2 target nucleic acids are NOT DETECTED.  The SARS-CoV-2 RNA is generally detectable in upper respiratory specimens during the acute phase of infection. The lowest concentration of SARS-CoV-2 viral copies this assay can detect is 138 copies/mL. Timothy negative result does not preclude SARS-Cov-2 infection and should not be used as the sole basis for treatment or other patient management decisions. Timothy negative result may occur with  improper specimen collection/handling, submission of specimen other than nasopharyngeal swab, presence of viral mutation(s) within the areas targeted by this assay, and inadequate number of viral copies(<138 copies/mL). Timothy negative result must be combined with clinical observations, patient history, and epidemiological information. The expected result is Negative.  Fact Sheet for Patients:  BloggerCourse.com  Fact Sheet for Healthcare Providers:  SeriousBroker.it  This test is no t  yet approved or cleared by the [place] and  has been authorized for detection and/or diagnosis of SARS-CoV-2 by FDA under an Emergency Use Authorization (EUA). This EUA will remain  in effect (meaning this test can be used) for the duration of the COVID-19 declaration under Section 564(b)(1) of the Act, 21 U.S.C.section 360bbb-3(b)(1), unless the authorization is terminated  or revoked sooner.       Influenza Timothy by PCR NEGATIVE NEGATIVE Final   Influenza B by PCR NEGATIVE NEGATIVE Final    Comment: (NOTE) The Xpert Xpress  SARS-CoV-2/FLU/RSV plus assay is intended as an aid in the diagnosis of influenza from Nasopharyngeal swab specimens and should not be used as Timothy sole basis for treatment. Nasal washings and aspirates are unacceptable for Xpert Xpress SARS-CoV-2/FLU/RSV testing.  Fact Sheet for Patients: BloggerCourse.com  Fact Sheet for Healthcare Providers: SeriousBroker.it  This test is not yet approved or cleared by the [place] FDA and has been authorized for detection and/or diagnosis of SARS-CoV-2 by FDA under an Emergency Use Authorization (EUA). This EUA will remain in effect (meaning this test can be used) for the duration of the COVID-19 declaration under Section 564(b)(1) of the Act, 21 U.S.C. section 360bbb-3(b)(1), unless the authorization is terminated or revoked.  Performed at [facility], [address] [place], [place] [address]      Time coordinating discharge: 39 minutes  SIGNED:   Alwyn Ren, MD  [facility] 10/20/2020, 11:54 AM

## 2020-10-21 LAB — CBG MONITORING, ED: Glucose-Capillary: 80 mg/dL (ref 70–99)

## 2020-10-21 LAB — T3, FREE: T3, Free: 0.9 pg/mL — ABNORMAL LOW (ref 2.0–4.4)

## 2020-10-22 LAB — I-STAT CHEM 8, ED
BUN: 17 mg/dL (ref 8–23)
Calcium, Ion: 1.12 mmol/L — ABNORMAL LOW (ref 1.15–1.40)
Chloride: 91 mmol/L — ABNORMAL LOW (ref 98–111)
Creatinine, Ser: 1.6 mg/dL — ABNORMAL HIGH (ref 0.61–1.24)
Glucose, Bld: 90 mg/dL (ref 70–99)
HCT: 46 % (ref 39.0–52.0)
Hemoglobin: 15.6 g/dL (ref 13.0–17.0)
Potassium: 4 mmol/L (ref 3.5–5.1)
Sodium: 132 mmol/L — ABNORMAL LOW (ref 135–145)
TCO2: 32 mmol/L (ref 22–32)

## 2020-10-22 LAB — THYROGLOBULIN ANTIBODY: Thyroglobulin Antibody: >2250 IU/mL — AB (ref 0.0–0.9)

## 2020-10-24 LAB — ACETYLCHOLINE RECEPTOR AB, ALL
Acety choline binding ab: <0.03 nmol/L (ref 0.00–0.24)
Acetylchol Block Ab: 11 % (ref 0–25)

## 2020-10-24 LAB — VITAMIN B1: Vitamin B1 (Thiamine): 117.3 nmol/L (ref 66.5–200.0)

## 2020-10-27 LAB — THYROGLOBULIN LEVEL: Thyroglobulin: 48 ng/mL — ABNORMAL HIGH

## 2020-11-21 ENCOUNTER — Other Ambulatory Visit: Payer: Self-pay

## 2020-11-21 MED ORDER — ALLOPURINOL 300 MG PO TABS: 300.0000 mg | ORAL_TABLET | Freq: Every day | ORAL | 0 refills | Status: DC

## 2020-11-21 NOTE — Telephone Encounter (Signed)
Sent. Thanks.  Please let pt/wife know.

## 2020-11-21 NOTE — Telephone Encounter (Signed)
Pt's wife requesting refill for pt for allopurinol.  Pt has hospital f/u next week.

## 2020-11-28 ENCOUNTER — Other Ambulatory Visit: Payer: Self-pay

## 2020-11-28 ENCOUNTER — Ambulatory Visit (INDEPENDENT_AMBULATORY_CARE_PROVIDER_SITE_OTHER): Payer: Medicare Other | Admitting: Family Medicine

## 2020-11-28 ENCOUNTER — Encounter: Payer: Self-pay | Admitting: Family Medicine

## 2020-11-28 ENCOUNTER — Other Ambulatory Visit: Payer: Self-pay | Admitting: Family Medicine

## 2020-11-28 VITALS — BP 130/84 | HR 86 | Temp 98.1°F | Ht 68.0 in | Wt 202.0 lb

## 2020-11-28 DIAGNOSIS — E039 Hypothyroidism, unspecified: Secondary | ICD-10-CM | POA: Diagnosis not present

## 2020-11-28 DIAGNOSIS — R7989 Other specified abnormal findings of blood chemistry: Secondary | ICD-10-CM

## 2020-11-28 DIAGNOSIS — H6980 Other specified disorders of Eustachian tube, unspecified ear: Secondary | ICD-10-CM

## 2020-11-28 DIAGNOSIS — I10 Essential (primary) hypertension: Secondary | ICD-10-CM | POA: Diagnosis not present

## 2020-11-28 DIAGNOSIS — L989 Disorder of the skin and subcutaneous tissue, unspecified: Secondary | ICD-10-CM

## 2020-11-28 DIAGNOSIS — M109 Gout, unspecified: Secondary | ICD-10-CM

## 2020-11-28 DIAGNOSIS — Z8739 Personal history of other diseases of the musculoskeletal system and connective tissue: Secondary | ICD-10-CM | POA: Diagnosis not present

## 2020-11-28 MED ORDER — FLUTICASONE PROPIONATE 50 MCG/ACT NA SUSP: 2.0000 | Freq: Every day | NASAL | Status: DC

## 2020-11-28 MED ORDER — METFORMIN HCL ER 500 MG PO TB24: 500.0000 mg | ORAL_TABLET | Freq: Every day | ORAL | Status: DC

## 2020-11-28 NOTE — Patient Instructions (Addendum)
Go to the lab on the way out.   If you have mychart we'll likely use that to update you.    Take care.  Glad to see you. Update me as needed.   Use flonase and gently try to pop you ears.   Plan on recheck in about 2 months.  We can do labs at the visit.   We'll call about seeing dermatology.

## 2020-11-28 NOTE — Progress Notes (Signed)
This visit occurred during the SARS-CoV-2 public health emergency.  Safety protocols were in place, including screening questions prior to the visit, additional usage of staff PPE, and extensive cleaning of exam room while observing appropriate contact time as indicated for disinfecting solutions.  Balance troubles.  More trouble standing than with walking.  No falls since 09/2021, using a cane.  Routine cautions discussed with patient.  He is not lightheaded.  Myasthenia gravis labs neg  He has occ popping in the R ear, followed by improved hearing that is temporary.  No fevers.  No chills  Hypothyroidism.  Previous TSH elevation discussed with patient.  On replacement.  Due for follow-up.  Previous labs discussed with patient, especially TSH elevation.   No recent gout flares.  See notes on labs.  He has a skin lesion he wants evaluated.  See exam.  COVID booster encouraged. . Meds, vitals, and allergies reviewed.   ROS: Per HPI unless specifically indicated in ROS section   nad ncat Neck supple no LA rrr ctab abd soft, not ttp.  Extremities well perfused. 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  L shoulder.  Preset for "a while" per patient report. Tympanic membranes with normal inspection but lack of movement of right TM with Valsalva.  39 minutes were devoted to patient care in this encounter (this includes time spent reviewing the patient's file/history, interviewing and examining the patient, counseling/reviewing plan with patient).

## 2020-11-29 LAB — BASIC METABOLIC PANEL
BUN: 15 mg/dL (ref 6–23)
CO2: 31 mEq/L (ref 19–32)
Calcium: 9.3 mg/dL (ref 8.4–10.5)
Chloride: 100 mEq/L (ref 96–112)
Creatinine, Ser: 1.14 mg/dL (ref 0.40–1.50)
GFR: 65.34 mL/min (ref >60.00)
Glucose, Bld: 95 mg/dL (ref 70–99)
Potassium: 4 mEq/L (ref 3.5–5.1)
Sodium: 140 mEq/L (ref 135–145)

## 2020-11-29 LAB — CBC WITH DIFFERENTIAL/PLATELET
Basophils Absolute: 0.1 10*3/uL (ref 0.0–0.1)
Basophils Relative: 1.2 % (ref 0.0–3.0)
Eosinophils Absolute: 0.5 10*3/uL (ref 0.0–0.7)
Eosinophils Relative: 5 % (ref 0.0–5.0)
HCT: 40.7 % (ref 39.0–52.0)
Hemoglobin: 13.7 g/dL (ref 13.0–17.0)
Lymphocytes Relative: 20 % (ref 12.0–46.0)
Lymphs Abs: 1.9 10*3/uL (ref 0.7–4.0)
MCHC: 33.7 g/dL (ref 30.0–36.0)
MCV: 96 fl (ref 78.0–100.0)
Monocytes Absolute: 1.1 10*3/uL — ABNORMAL HIGH (ref 0.1–1.0)
Monocytes Relative: 11.6 % (ref 3.0–12.0)
Neutro Abs: 6 10*3/uL (ref 1.4–7.7)
Neutrophils Relative %: 62.2 % (ref 43.0–77.0)
Platelets: 210 10*3/uL (ref 150.0–400.0)
RBC: 4.23 Mil/uL (ref 4.22–5.81)
RDW: 13.8 % (ref 11.5–15.5)
WBC: 9.6 10*3/uL (ref 4.0–10.5)

## 2020-11-29 LAB — URIC ACID: Uric Acid, Serum: 4 mg/dL (ref 4.0–7.8)

## 2020-11-29 LAB — TSH: TSH: 6.55 u[IU]/mL — ABNORMAL HIGH (ref 0.35–4.50)

## 2020-12-01 ENCOUNTER — Other Ambulatory Visit: Payer: Self-pay | Admitting: Family Medicine

## 2020-12-01 DIAGNOSIS — E039 Hypothyroidism, unspecified: Secondary | ICD-10-CM

## 2020-12-01 DIAGNOSIS — H6980 Other specified disorders of Eustachian tube, unspecified ear: Secondary | ICD-10-CM | POA: Insufficient documentation

## 2020-12-01 DIAGNOSIS — L989 Disorder of the skin and subcutaneous tissue, unspecified: Secondary | ICD-10-CM | POA: Insufficient documentation

## 2020-12-01 DIAGNOSIS — H6990 Unspecified Eustachian tube disorder, unspecified ear: Secondary | ICD-10-CM | POA: Insufficient documentation

## 2020-12-01 NOTE — Assessment & Plan Note (Signed)
History of.  See notes on uric acid level.

## 2020-12-01 NOTE — Assessment & Plan Note (Signed)
Right-sided, abdomen Flonase, gently perform Valsalva and update me as needed.  He agrees.  Only minimal cerumen in the canal bilaterally and this is not obstructed.

## 2020-12-01 NOTE — Assessment & Plan Note (Signed)
Could be squamous cell cancer.  Discussed with patient.  Refer to dermatology.  He agrees.

## 2020-12-01 NOTE — Assessment & Plan Note (Signed)
Rationale for placement and symptoms of hypothyroidism discussed with patient.  No thyromegaly on exam.  See notes on follow-up labs.

## 2020-12-01 NOTE — Assessment & Plan Note (Signed)
He is not lightheaded and I do not think this is related to his blood pressure.  Would continue losartan as is.  See notes on labs.

## 2021-01-26 ENCOUNTER — Telehealth: Payer: Self-pay | Admitting: Family Medicine

## 2021-01-26 NOTE — Telephone Encounter (Signed)
Please check with patient/get him scheduled for office visit.  Update given by Syliva Overman on behalf the patient, concern for possible weight loss and consideration of colonoscopy.  I can go ahead and put in the referral to GI in the meantime if needed, but if there is any concern for weight loss or any other symptoms then he needs an office visit too.  Thanks.

## 2021-01-27 NOTE — Telephone Encounter (Signed)
Patient appt has been moved up to 02/04/21 at 3pm

## 2021-01-27 NOTE — Telephone Encounter (Signed)
LMTCB to move up patients appt

## 2021-01-27 NOTE — Telephone Encounter (Signed)
Has an appt on 03/05/21 already; do you want me to move that up?

## 2021-01-27 NOTE — Telephone Encounter (Signed)
Likely yes, please check with patient about his symptoms.  Thanks.

## 2021-02-04 ENCOUNTER — Encounter: Payer: Self-pay | Admitting: Family Medicine

## 2021-02-04 ENCOUNTER — Ambulatory Visit (INDEPENDENT_AMBULATORY_CARE_PROVIDER_SITE_OTHER): Payer: Medicare Other | Admitting: Family Medicine

## 2021-02-04 ENCOUNTER — Other Ambulatory Visit: Payer: Self-pay

## 2021-02-04 VITALS — BP 120/82 | HR 87 | Temp 97.6°F | Ht 68.0 in | Wt 204.0 lb

## 2021-02-04 DIAGNOSIS — R739 Hyperglycemia, unspecified: Secondary | ICD-10-CM

## 2021-02-04 DIAGNOSIS — M109 Gout, unspecified: Secondary | ICD-10-CM

## 2021-02-04 DIAGNOSIS — E039 Hypothyroidism, unspecified: Secondary | ICD-10-CM

## 2021-02-04 DIAGNOSIS — Z1211 Encounter for screening for malignant neoplasm of colon: Secondary | ICD-10-CM

## 2021-02-04 DIAGNOSIS — Z8739 Personal history of other diseases of the musculoskeletal system and connective tissue: Secondary | ICD-10-CM | POA: Diagnosis not present

## 2021-02-04 DIAGNOSIS — E785 Hyperlipidemia, unspecified: Secondary | ICD-10-CM

## 2021-02-04 DIAGNOSIS — I10 Essential (primary) hypertension: Secondary | ICD-10-CM

## 2021-02-04 MED ORDER — ASPIRIN EC 81 MG PO TBEC: 81.0000 mg | DELAYED_RELEASE_TABLET | Freq: Every day | ORAL | 3 refills | Status: AC

## 2021-02-04 MED ORDER — ATORVASTATIN CALCIUM 40 MG PO TABS
40.0000 mg | ORAL_TABLET | Freq: Every day | ORAL | 3 refills | Status: AC
Start: 2021-02-04 — End: ?

## 2021-02-04 MED ORDER — LEVOTHYROXINE SODIUM 100 MCG PO TABS
100.0000 ug | ORAL_TABLET | Freq: Every day | ORAL | 3 refills | Status: AC
Start: 2021-02-04 — End: ?

## 2021-02-04 MED ORDER — MELOXICAM 7.5 MG PO TABS: 7.5000 mg | ORAL_TABLET | Freq: Two times a day (BID) | ORAL | 3 refills | Status: AC | PRN

## 2021-02-04 MED ORDER — ALLOPURINOL 300 MG PO TABS: 150.0000 mg | ORAL_TABLET | Freq: Every day | ORAL | 3 refills | Status: AC

## 2021-02-04 MED ORDER — METFORMIN HCL ER 500 MG PO TB24
500.0000 mg | ORAL_TABLET | Freq: Every day | ORAL | 3 refills | Status: AC
Start: 2021-02-04 — End: ?

## 2021-02-04 MED ORDER — LOSARTAN POTASSIUM 100 MG PO TABS
100.0000 mg | ORAL_TABLET | Freq: Every day | ORAL | 3 refills | Status: AC
Start: 2021-02-04 — End: ?

## 2021-02-04 MED ORDER — ALLOPURINOL 300 MG PO TABS: 150.0000 mg | ORAL_TABLET | Freq: Every day | ORAL | Status: DC

## 2021-02-04 NOTE — Patient Instructions (Addendum)
I'll await the follow up notes from dermatology.   Go to the lab on the way out.   If you have mychart we'll likely use that to update you.    We'll call about getting cologuard set up.  Take care.  Glad to see you.

## 2021-02-04 NOTE — Progress Notes (Signed)
This visit occurred during the SARS-CoV-2 public health emergency.  Safety protocols were in place, including screening questions prior to the visit, additional usage of staff PPE, and extensive cleaning of exam room while observing appropriate contact time as indicated for disinfecting solutions.  He is not on amlodipine.  He is taking 150mg  allopurinol, he cut back from 300 mg.  No gout sx in the meantime.  Fatigued, "no stamina."  No CP, not SOB.  "I can walk a pretty good ways" but gets tired at the end of the day, "my back and legs get weak."  No foot drop or paralysis.  Prolonged standing bothers his back.  The changes have been noted since his surgery.  His pain is clearly better since his surgery.  Taking Metformin without adverse effect on medication.  Due for follow-up labs.  History of hyperglycemia.  D/w patient for colon cancer screening, including IFOB vs. colonoscopy.  Risks and benefits of both were discussed and patient voiced understanding.  Pt elects for cologuard.    He had lesion removed from L shoulder.  He has derm f/u pending.    Meds, vitals, and allergies reviewed.   ROS: Per HPI unless specifically indicated in ROS section   GEN: nad, alert and oriented HEENT:ncat NECK: supple w/o LA CV: rrr. PULM: ctab, no inc wob ABD: soft, +bs EXT: no edema SKIN: no acute rash, he has well demarcated healing excision site on the left shoulder.   35 minutes were devoted to patient care in this encounter (this includes time spent reviewing the patient's file/history, interviewing and examining the patient, counseling/reviewing plan with patient).

## 2021-02-05 DIAGNOSIS — Z1211 Encounter for screening for malignant neoplasm of colon: Secondary | ICD-10-CM | POA: Insufficient documentation

## 2021-02-05 LAB — TSH: TSH: 4.96 u[IU]/mL — ABNORMAL HIGH (ref 0.35–4.50)

## 2021-02-05 LAB — CBC WITH DIFFERENTIAL/PLATELET
Basophils Absolute: 0.1 10*3/uL (ref 0.0–0.1)
Basophils Relative: 0.6 % (ref 0.0–3.0)
Eosinophils Absolute: 0.5 10*3/uL (ref 0.0–0.7)
Eosinophils Relative: 5.3 % — ABNORMAL HIGH (ref 0.0–5.0)
HCT: 42.4 % (ref 39.0–52.0)
Hemoglobin: 14.2 g/dL (ref 13.0–17.0)
Lymphocytes Relative: 22.9 % (ref 12.0–46.0)
Lymphs Abs: 2.3 10*3/uL (ref 0.7–4.0)
MCHC: 33.4 g/dL (ref 30.0–36.0)
MCV: 94.4 fl (ref 78.0–100.0)
Monocytes Absolute: 1 10*3/uL (ref 0.1–1.0)
Monocytes Relative: 9.9 % (ref 3.0–12.0)
Neutro Abs: 6.2 10*3/uL (ref 1.4–7.7)
Neutrophils Relative %: 61.3 % (ref 43.0–77.0)
Platelets: 213 10*3/uL (ref 150.0–400.0)
RBC: 4.5 Mil/uL (ref 4.22–5.81)
RDW: 14.1 % (ref 11.5–15.5)
WBC: 10 10*3/uL (ref 4.0–10.5)

## 2021-02-05 LAB — COMPREHENSIVE METABOLIC PANEL
ALT: 16 U/L (ref 0–53)
AST: 15 U/L (ref 0–37)
Albumin: 4.1 g/dL (ref 3.5–5.2)
Alkaline Phosphatase: 64 U/L (ref 39–117)
BUN: 17 mg/dL (ref 6–23)
CO2: 32 mEq/L (ref 19–32)
Calcium: 9.4 mg/dL (ref 8.4–10.5)
Chloride: 101 mEq/L (ref 96–112)
Creatinine, Ser: 1.19 mg/dL (ref 0.40–1.50)
GFR: 61.97 mL/min (ref >60.00)
Glucose, Bld: 110 mg/dL — ABNORMAL HIGH (ref 70–99)
Potassium: 4.3 mEq/L (ref 3.5–5.1)
Sodium: 138 mEq/L (ref 135–145)
Total Bilirubin: 0.5 mg/dL (ref 0.2–1.2)
Total Protein: 6.8 g/dL (ref 6.0–8.3)

## 2021-02-05 LAB — LIPID PANEL
Cholesterol: 92 mg/dL (ref 0–200)
HDL: 31.1 mg/dL — ABNORMAL LOW (ref >39.00)
LDL Cholesterol: 39 mg/dL (ref 0–99)
NonHDL: 60.85
Total CHOL/HDL Ratio: 3
Triglycerides: 109 mg/dL (ref 0.0–149.0)
VLDL: 21.8 mg/dL (ref 0.0–40.0)

## 2021-02-05 LAB — URIC ACID: Uric Acid, Serum: 4.9 mg/dL (ref 4.0–7.8)

## 2021-02-05 LAB — HEMOGLOBIN A1C: Hgb A1c MFr Bld: 6.6 % — ABNORMAL HIGH (ref 4.6–6.5)

## 2021-02-05 NOTE — Assessment & Plan Note (Signed)
Still on replacement.  History of abnormal TSH.  recheck TSH pending.  Continue levothyroxine.

## 2021-02-05 NOTE — Assessment & Plan Note (Signed)
See notes on labs.  Continue allopurinol 150 mg a day.  No recent gout symptoms.

## 2021-02-05 NOTE — Assessment & Plan Note (Signed)
Off amlodipine, blood pressure is reasonable.  Continue losartan for now.  See notes on labs.

## 2021-02-05 NOTE — Assessment & Plan Note (Signed)
Continued on atorvastatin.  See notes on labs.

## 2021-02-05 NOTE — Assessment & Plan Note (Signed)
We will arrange for Cologuard.

## 2021-02-05 NOTE — Assessment & Plan Note (Signed)
Recheck A1c pending.  Continue Metformin.  See notes on labs.

## 2021-02-06 NOTE — Addendum Note (Signed)
Addended by: Wendie Simmer B on: 02/06/2021 04:22 PM   Modules accepted: Orders

## 2021-03-04 ENCOUNTER — Ambulatory Visit: Payer: Medicare Other | Admitting: Family Medicine

## 2021-03-07 ENCOUNTER — Telehealth: Payer: Self-pay

## 2021-03-07 MED ORDER — DOXYCYCLINE HYCLATE 100 MG PO TABS: 100.0000 mg | ORAL_TABLET | Freq: Two times a day (BID) | ORAL | 0 refills | Status: AC

## 2021-03-07 NOTE — Telephone Encounter (Signed)
Called and updated patients wife who verbalized understanding.

## 2021-03-07 NOTE — Telephone Encounter (Signed)
Would rec covid test if not already done- can likely get a home test.  Let us know if positive.  Assuming covid neg- If not already using flonase and claritin, then start both.  Both are OTC.  If already using both meds, then start doxycycline.  rx sent.  Thanks.

## 2021-03-07 NOTE — Addendum Note (Signed)
Addended by: Joaquim Nam on: 03/07/2021 04:59 PM   Modules accepted: Orders

## 2021-03-07 NOTE — Telephone Encounter (Signed)
Patients wife called and stated that her husband has been working out in the garden all week in the dirt and the dust and now he is having severe nasal congestion and pain around his eyes. Patient denied SOB, chest pain, fever, sore throat, cough, N/V/D, H/A, loss of taste or smell. Angelique Blonder denied patients recent exposure to COVID. Patient is refusing to go to UC and wanted to know if Dr. Para March can call in an antibiotic before the weekend. Please advise.

## 2021-06-04 ENCOUNTER — Ambulatory Visit: Payer: Medicare Other | Admitting: Dermatology

## 2021-10-02 ENCOUNTER — Ambulatory Visit: Payer: Medicare Other | Admitting: Nurse Practitioner

## 2021-10-06 ENCOUNTER — Ambulatory Visit: Payer: Medicare Other | Admitting: Family Medicine

## 2021-10-06 ENCOUNTER — Telehealth: Payer: Self-pay | Admitting: Family Medicine

## 2021-10-06 NOTE — Telephone Encounter (Signed)
I contacted his niece and gave condolences.  This gentleman will be missed.

## 2021-10-26 DEATH — deceased
